# Patient Record
Sex: Male | Born: 1965 | Race: White | Hispanic: No | Marital: Married | Smoking: Current every day smoker
Health system: Southern US, Community
[De-identification: ages and names within clinical notes are randomized; demographics above are authoritative.]

## PROBLEM LIST (undated history)

## (undated) DIAGNOSIS — I10 Essential (primary) hypertension: Secondary | ICD-10-CM

## (undated) DIAGNOSIS — E7800 Pure hypercholesterolemia, unspecified: Secondary | ICD-10-CM

---

## 2004-07-20 ENCOUNTER — Ambulatory Visit (HOSPITAL_COMMUNITY): Admission: RE | Admit: 2004-07-20 | Discharge: 2004-07-20 | Payer: Self-pay | Admitting: Neurological Surgery

## 2004-08-22 ENCOUNTER — Encounter: Admission: RE | Admit: 2004-08-22 | Discharge: 2004-08-22 | Payer: Self-pay | Admitting: Neurological Surgery

## 2005-01-02 ENCOUNTER — Encounter: Admission: RE | Admit: 2005-01-02 | Discharge: 2005-01-02 | Payer: Self-pay | Admitting: Neurological Surgery

## 2005-03-02 ENCOUNTER — Encounter: Admission: RE | Admit: 2005-03-02 | Discharge: 2005-03-02 | Payer: Self-pay | Admitting: Gastroenterology

## 2005-03-08 ENCOUNTER — Ambulatory Visit: Payer: Self-pay | Admitting: Family Medicine

## 2005-03-14 ENCOUNTER — Encounter: Admission: RE | Admit: 2005-03-14 | Discharge: 2005-03-14 | Payer: Self-pay | Admitting: Gastroenterology

## 2005-05-01 ENCOUNTER — Ambulatory Visit (HOSPITAL_COMMUNITY): Admission: RE | Admit: 2005-05-01 | Discharge: 2005-05-01 | Payer: Self-pay | Admitting: Orthopedic Surgery

## 2005-05-10 ENCOUNTER — Encounter: Admission: RE | Admit: 2005-05-10 | Discharge: 2005-05-10 | Payer: Self-pay | Admitting: Orthopedic Surgery

## 2006-03-29 ENCOUNTER — Encounter: Admission: RE | Admit: 2006-03-29 | Discharge: 2006-03-29 | Payer: Self-pay | Admitting: Orthopedic Surgery

## 2006-04-02 ENCOUNTER — Encounter: Admission: RE | Admit: 2006-04-02 | Discharge: 2006-04-02 | Payer: Self-pay | Admitting: Neurological Surgery

## 2006-04-10 ENCOUNTER — Encounter: Admission: RE | Admit: 2006-04-10 | Discharge: 2006-04-10 | Payer: Self-pay | Admitting: Neurological Surgery

## 2006-05-15 ENCOUNTER — Ambulatory Visit (HOSPITAL_COMMUNITY): Admission: RE | Admit: 2006-05-15 | Discharge: 2006-05-15 | Payer: Self-pay | Admitting: Neurological Surgery

## 2006-06-18 ENCOUNTER — Encounter: Admission: RE | Admit: 2006-06-18 | Discharge: 2006-06-18 | Payer: Self-pay | Admitting: Neurological Surgery

## 2006-08-05 ENCOUNTER — Encounter: Admission: RE | Admit: 2006-08-05 | Discharge: 2006-08-05 | Payer: Self-pay | Admitting: Neurological Surgery

## 2006-10-08 ENCOUNTER — Encounter: Admission: RE | Admit: 2006-10-08 | Discharge: 2006-10-08 | Payer: Self-pay | Admitting: Neurological Surgery

## 2006-11-27 ENCOUNTER — Ambulatory Visit (HOSPITAL_COMMUNITY): Admission: RE | Admit: 2006-11-27 | Discharge: 2006-11-27 | Payer: Self-pay | Admitting: Neurological Surgery

## 2006-12-13 ENCOUNTER — Emergency Department (HOSPITAL_COMMUNITY): Admission: EM | Admit: 2006-12-13 | Discharge: 2006-12-13 | Payer: Self-pay | Admitting: Emergency Medicine

## 2007-01-02 ENCOUNTER — Ambulatory Visit (HOSPITAL_COMMUNITY): Admission: RE | Admit: 2007-01-02 | Discharge: 2007-01-02 | Payer: Self-pay | Admitting: Family Medicine

## 2007-08-26 ENCOUNTER — Ambulatory Visit (HOSPITAL_COMMUNITY): Admission: RE | Admit: 2007-08-26 | Discharge: 2007-08-26 | Payer: Self-pay | Admitting: Internal Medicine

## 2007-12-11 ENCOUNTER — Ambulatory Visit (HOSPITAL_COMMUNITY): Admission: RE | Admit: 2007-12-11 | Discharge: 2007-12-11 | Payer: Self-pay | Admitting: *Deleted

## 2008-04-11 ENCOUNTER — Emergency Department (HOSPITAL_COMMUNITY): Admission: EM | Admit: 2008-04-11 | Discharge: 2008-04-11 | Payer: Self-pay | Admitting: Emergency Medicine

## 2008-12-21 IMAGING — RF IR MYELOGRAM [PERSON_NAME]
13 of 23 series · 13 of 23 positions shown · IV contrast (omnipaque)
Comparison: none

CLINICAL DATA: Bilateral leg pain and numbness.  Right arm pain and numbness.  Prior cervical fusion. 
 TOTAL MYELOGRAM - LUMBAR COMPONENT:
TECHNIQUE: A lumbar puncture was performed from a right sided approach to the midline at the L3-4 interlaminar space using a 22 gauge spinal needle. 10 cc of Omnipaque 300 were instilled.
TECHNIQUE: Multidetector CT imaging of the lumbar spine was performed after intrathecal injection of contrast.  Multiplanar CT image reconstructions were also generated.
TECHNIQUE: Multidetector CT imaging of the cervical spine was performed after intrathecal injection of contrast.  Multiplanar CT image reconstructions were also generated.

[Series 1: (hospital) · 1 of 1 slices shown]
[im 1/1]
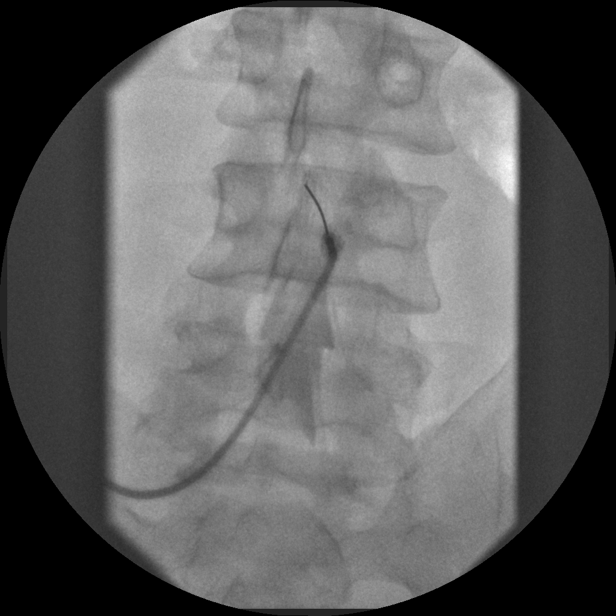

[Series 3: myelogram  white · 1 of 1 slices shown (1 of 12)]
[im 1/1]
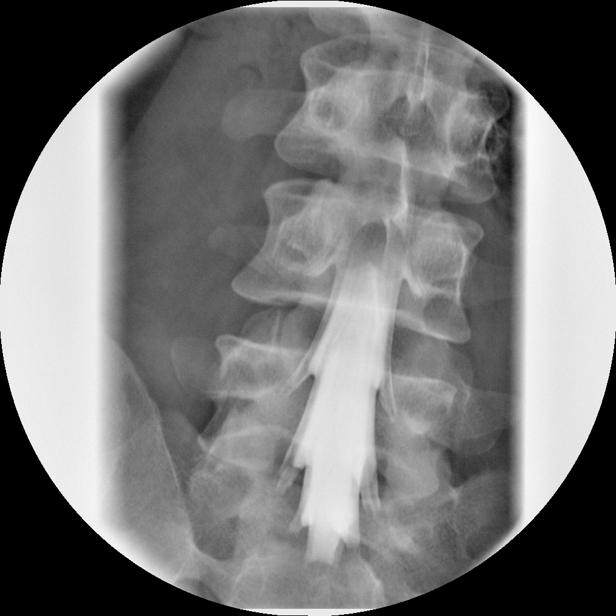

[Series 5: myelogram  white · 1 of 1 slices shown (2 of 12)]
[im 1/1]
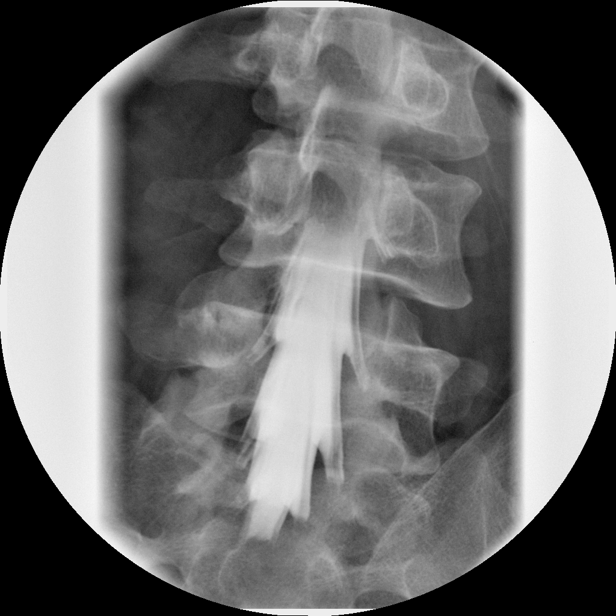

[Series 7: myelogram  white · 1 of 1 slices shown (3 of 12)]
[im 1/1]
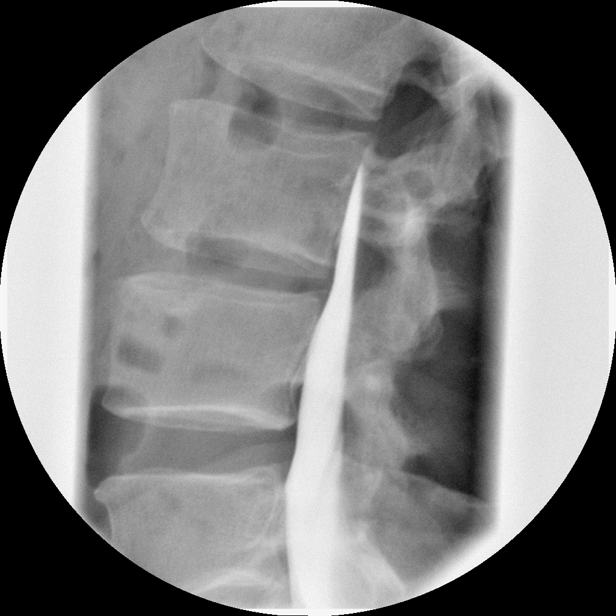

[Series 8: myelogram  white · 1 of 1 slices shown (4 of 12)]
[im 1/1]
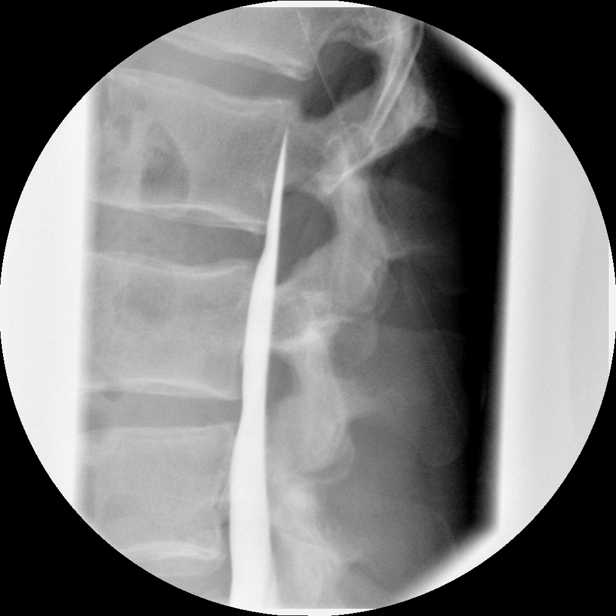

[Series 10: myelogram  white · 1 of 1 slices shown (5 of 12)]
[im 1/1]
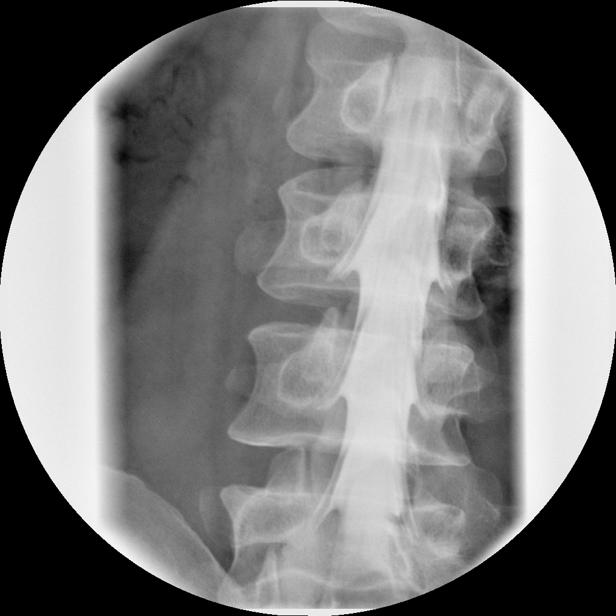

[Series 12: myelogram  white · 1 of 1 slices shown (6 of 12)]
[im 1/1]
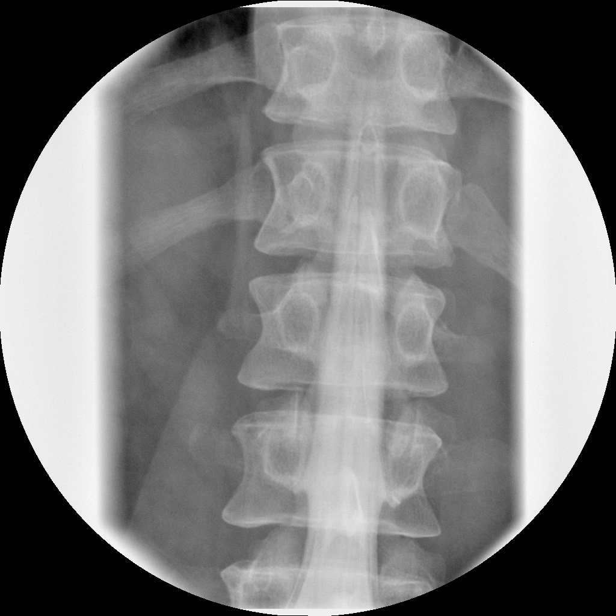

[Series 14: myelogram  white · 1 of 1 slices shown (7 of 12)]
[im 1/1]
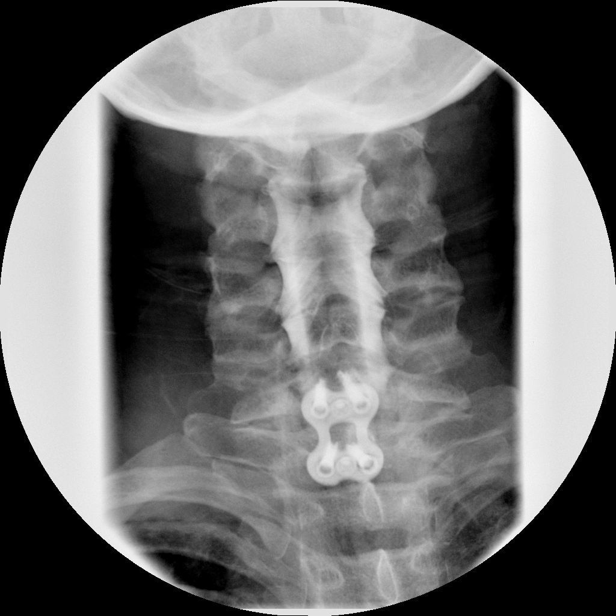

[Series 16: myelogram  white · 1 of 1 slices shown (8 of 12)]
[im 1/1]
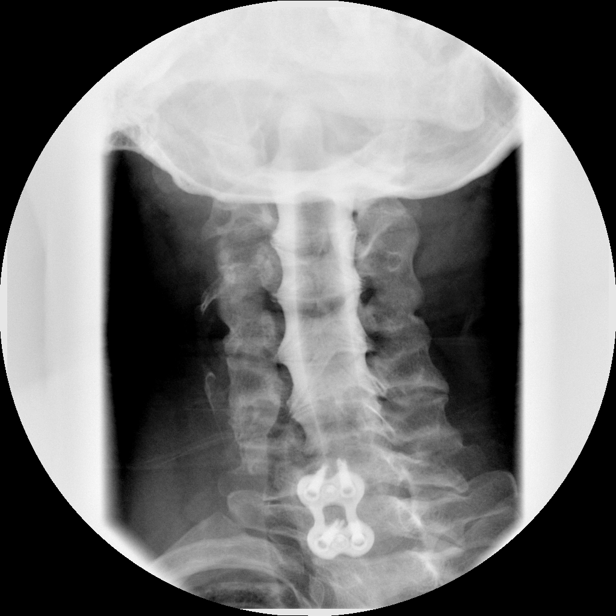

[Series 17: myelogram  white · 1 of 1 slices shown (9 of 12)]
[im 1/1]
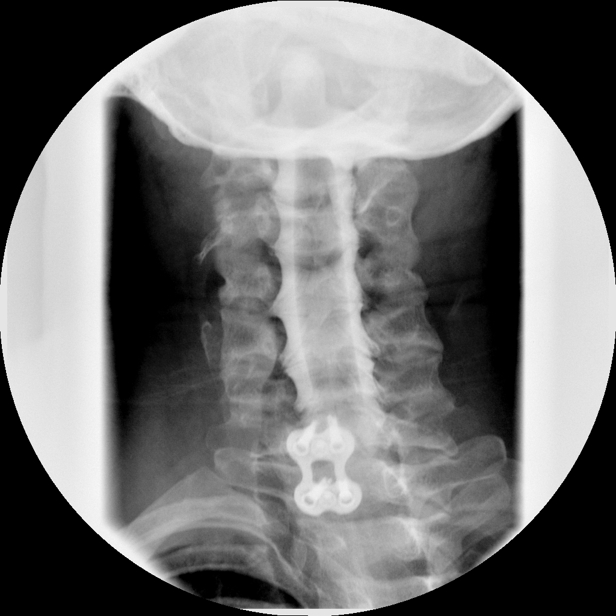

[Series 19: myelogram  white · 1 of 1 slices shown (10 of 12)]
[im 1/1]
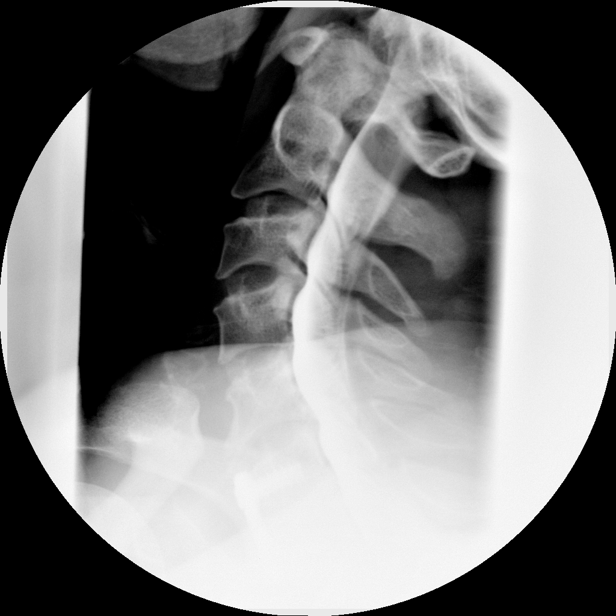

[Series 21: myelogram  white · 1 of 1 slices shown (11 of 12)]
[im 1/1]
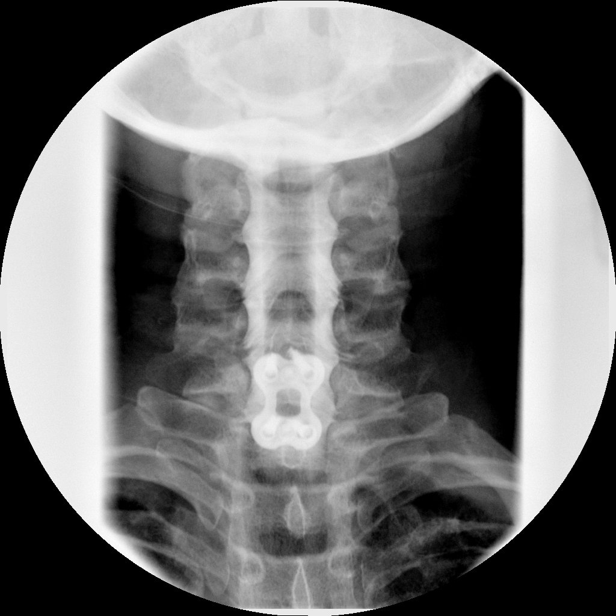

[Series 23: myelogram  white · 1 of 1 slices shown (12 of 12)]
[im 1/1]
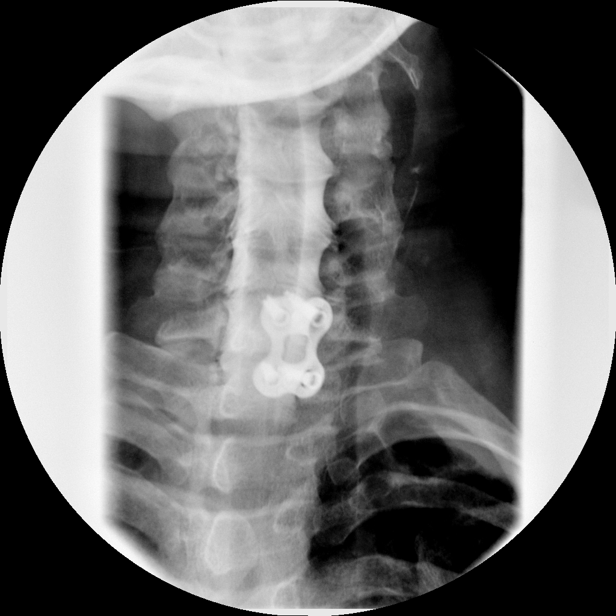

[13 of 23 positions shown; findings below may reference images not displayed]

FINDINGS: The spinal canal is widely patent.  All root sleeves fill normally.  The distal cord and conus appear normal.
IMPRESSION: Normal lumbar component of the exam. 
 TOTAL MYELOGRAM   - CERVICAL COMPONENT:
FINDINGS: The patient has had previous anterior cervical diskectomy and fusion at C6-7.  There are small anterior extradural defects at C3-4 and C5-6.  There is slightly diminished root sleeve filling of both C5 and C6 root sleeves, but this does not appear pronounced.
IMPRESSION: 1.  Prior fusion at C6-7.
 2.  Small anterior extradural defect at C3-4 and C5-6.  
 3.  Slightly diminished filling of the root sleeves of the C5 and C6 roots bilaterally. 
 POST MYELOGRAM CT SCAN OF THE LUMBAR SPINE:
FINDINGS: The alignment of the spine is normal.  The L1-2 level is normal with the exception that the conus tip is low lying, at the upper aspect of L3.
 L2-3:  Normal interspace except for the previously described low lying conus tip.  
 L3-4:  No disk bulge or herniation.  There is superior endplate Schmorl?s node anteriorly at L4.
 L4-5:  Normal interspace. 
 L5-S1:  Normal interspace.
 No pars defects or facet arthropathy.
IMPRESSION: 1.  No evidence of acquired disk pathology, stenosis, or neural compression.
 2.  Low lying conus tip, at the superior endplate of L3.
 3.  Old superior endplate Schmorl?s node at L4.
 POST MYELOGRAM CT SCAN OF THE CERVICAL SPINE:
FINDINGS: There is no abnormality at the foramen magnum, C1-2, or C2-3.  There is a small cyst in the anterosuperior C3 vertebral body which contains a bit of nitrogen gas.  This should not be significant.
 C3-4:  Minimal uncovertebral prominence with no compressive stenosis.
 C4-5:  Normal interspace. 
 C5-6:  There is uncovertebral prominence bilaterally.  There is a shallow disk protrusion.  The ventral subarachnoid space is narrowed.  There is mild encroachment upon the foramina, more on the right than the left.  It is possible the right C6 nerve root could be irritated, though distinct compression is not established. 
 C6-7:  The patient has had prior anterior cervical diskectomy and fusion.  The canal and foramina are widely patent.  There is no lucency around the screws.  
 C7-T1:  Normal interspace. 
 T1-2:  Normal interspace.
IMPRESSION: 1.  Satisfactory appearance at the fusion level of C6-7 and below.
 2.  Degenerative disease of C5-6 with uncovertebral prominence and a shallow disk protrusion.  The ventral subarachnoid space is narrowed, but the cord is not compressed.  There is encroachment upon the foramina, slightly more on the right than the left. 
 3.  Uncovertebral prominence at C3-4 without apparent neural compression.

## 2008-12-29 ENCOUNTER — Ambulatory Visit (HOSPITAL_COMMUNITY): Admission: RE | Admit: 2008-12-29 | Discharge: 2008-12-29 | Payer: Self-pay | Admitting: Family Medicine

## 2009-02-24 ENCOUNTER — Ambulatory Visit: Payer: Self-pay | Admitting: Internal Medicine

## 2009-02-24 DIAGNOSIS — J309 Allergic rhinitis, unspecified: Secondary | ICD-10-CM | POA: Insufficient documentation

## 2009-02-24 DIAGNOSIS — M545 Low back pain: Secondary | ICD-10-CM | POA: Insufficient documentation

## 2009-02-24 DIAGNOSIS — R519 Headache, unspecified: Secondary | ICD-10-CM | POA: Insufficient documentation

## 2009-02-24 DIAGNOSIS — I10 Essential (primary) hypertension: Secondary | ICD-10-CM | POA: Insufficient documentation

## 2009-02-24 DIAGNOSIS — M1990 Unspecified osteoarthritis, unspecified site: Secondary | ICD-10-CM | POA: Insufficient documentation

## 2009-02-24 DIAGNOSIS — E785 Hyperlipidemia, unspecified: Secondary | ICD-10-CM

## 2009-02-24 DIAGNOSIS — R51 Headache: Secondary | ICD-10-CM | POA: Insufficient documentation

## 2009-02-24 LAB — CONVERTED CEMR LAB: Cholesterol, target level: 200 mg/dL

## 2010-03-25 ENCOUNTER — Encounter: Payer: Self-pay | Admitting: Gastroenterology

## 2010-03-26 ENCOUNTER — Encounter: Payer: Self-pay | Admitting: Neurological Surgery

## 2010-05-02 ENCOUNTER — Other Ambulatory Visit: Payer: Self-pay | Admitting: Internal Medicine

## 2010-05-02 DIAGNOSIS — R1011 Right upper quadrant pain: Secondary | ICD-10-CM

## 2010-05-03 ENCOUNTER — Ambulatory Visit
Admission: RE | Admit: 2010-05-03 | Discharge: 2010-05-03 | Disposition: A | Discharge status: Home / Self Care | Payer: BC Managed Care – PPO | Source: Ambulatory Visit | Attending: Internal Medicine | Admitting: Internal Medicine

## 2010-05-03 DIAGNOSIS — R1011 Right upper quadrant pain: Secondary | ICD-10-CM

## 2010-06-20 LAB — BASIC METABOLIC PANEL
CO2: 27 mEq/L (ref 19–32)
Calcium: 8.8 mg/dL (ref 8.4–10.5)
GFR calc Af Amer: >60 mL/min (ref >60)
GFR calc non Af Amer: >60 mL/min (ref >60)
Potassium: 3.5 mEq/L (ref 3.5–5.1)
Sodium: 140 mEq/L (ref 135–145)

## 2010-06-20 LAB — CBC
HCT: 44.5 % (ref 39.0–52.0)
Hemoglobin: 15.1 g/dL (ref 13.0–17.0)
RDW: 14.1 % (ref 11.5–15.5)
WBC: 20.1 10*3/uL — ABNORMAL HIGH (ref 4.0–10.5)

## 2010-06-20 LAB — DIFFERENTIAL
Eosinophils Relative: 1 % (ref 0–5)
Lymphocytes Relative: 15 % (ref 12–46)
Lymphs Abs: 3 10*3/uL (ref 0.7–4.0)
Monocytes Absolute: 1 10*3/uL (ref 0.1–1.0)

## 2010-07-21 NOTE — Consult Note (Signed)
NAMELATREL, SZYMCZAK NO.:  [id number]   MEDICAL RECORD NO.:  [id number]          PATIENT TYPE:  EMS   LOCATION:  ED                            FACILITY:  [facility]   PHYSICIAN:  Tilford Pillar, MD      DATE OF BIRTH:  Jul 20, 1965   DATE OF CONSULTATION:  DATE OF DISCHARGE:  04/11/2008                                 CONSULTATION   REASON FOR CONSULTATION:  Rectal laceration.   HISTORY OF PRESENT ILLNESS:  The patient is a 45 year old male with a  history of hypertension and anxiety and multiple surgeries on both lower  extremities who states that he had an episode of a fall this evening.  The patient states that he has been caring for a rescue dog and was  walking the dog on a leash.  As the dog, which is a large breed pulled  on the leash, he did have some instability in his gait and lost his  balance and fell backwards onto the edge of his truck.  He did land up  in the presacral area.  Since that time, he has had a significant amount  of pain and bleeding noted from this area.  He stated that the bleeding  actually worsened after he had a bowel movement, but since the accident  has had normal control of his bowel movements.  He has had no fever and  chills.  He has had no inability to control flatus.  He has had no  inability to control bowel movements.   PAST MEDICAL HISTORY:  Hypertension, anxiety.   PAST SURGICAL HISTORY:  He has had left ankle surgery.  He has had a  right foot surgery.   MEDICATIONS:  Verapamil, Benicar, Valium, prednisone, Percocet.   ALLERGIES:  No known drug allergies.   SOCIAL HISTORY:  He is a one-pack per day smoker.  No alcohol use.  No  recreational drug use.   PHYSICAL EXAMINATION:  VITAL SIGNS:  97.2, heart rate 103, respirations  20, blood pressure 151/96.  GENERAL:  In general, the patient is comfortable in this.  He is not in  any acute distress.  He is alert and oriented x3.  He is sitting in a  seated position in an  emergency room cart.  HEENT:  Pupils are equal, round, reactive.  Extraocular movements are  intact.  Oral mucosa is pink.  No malocclusion.  PULMONARY:  Unlabored.  He is clear to auscultation.  CARDIOVASCULAR:  He is tachycardic, but regular rhythm.  No murmurs are  apparent on exam today.  ABDOMINAL EXAM:  Soft, nontender.  On evaluation of his perirectal area,  there is an approximate 3-cm laceration.  This is over the coccygeal  region.  He is exquisitely tender.  There is some ecchymosis surrounding  the area.  Rectal:  On digital rectal exam, he has good sphincter tone.  There is  no evidence of any internal laceration or continuation of the laceration  into the rectum.   ASSESSMENT/PLAN:  Perirectal laceration.  At this time, I did discuss  with the patient the  risks, benefits and alternatives of closure.  At  this time, I will irrigate the wound and plan to proceed with  interrupted closure.   PROCEDURE:  At this time, the wound was cleansed.  Betadine was used for  prep, and 1% lidocaine was injected around the area.  A 3-0 chromic was  used in an interrupted fashion to reapproximate the skin around the  area.  I did scan together without any difficulties, and at this time, a  dressing was placed.  The patient will be discharged with close  followup.  He will be continued on oral antibiotics.  He is instructed  on continuing sitz bath, especially after bowel movements.  He is to  call if he develops any fever, chills, or any increased drainage in the  area.  He is to follow up in my office this coming Thursday.  The  patient understands and will call if he has any problems or  difficulties.      Tilford Pillar, MD  Electronically Signed     BZ/MEDQ  D:  04/13/2008  T:  04/14/2008  Job:  [id number]

## 2010-07-21 NOTE — Op Note (Signed)
NAMEGILBERT, MANOLIS NO.:  [id number]   MEDICAL RECORD NO.:  [id number]          PATIENT TYPE:  OIB   LOCATION:  2550                         FACILITY:  [facility]   PHYSICIAN:  Tia Alert, MD     DATE OF BIRTH:  09-26-1965   DATE OF PROCEDURE:  07/20/2004  DATE OF DISCHARGE:                                 OPERATIVE REPORT   PREOPERATIVE DIAGNOSES:  Cervical spondylosis C6-C7, with left C7  radiculopathy.   POSTOPERATIVE DIAGNOSES:  Cervical spondylosis, C6-C7, with left C7  radiculopathy.   PROCEDURES:  1.  Decompressive anterior cervical diskectomy, C6-C7.  2.  Anterior cervical arthrodesis, C6-C7, utilizing a VG2 allograft.  3.  Anterior cervical plating, C6-C7, utilizing a 25-mm Atlantis Vision      plate.   SURGEON:  Tia Alert, M.D.   ASSISTANT:  Reinaldo Meeker, M.D.   ANESTHESIA:  General endotracheal.   COMPLICATIONS:  None apparent.   INDICATIONS FOR THE PROCEDURE:  Mr. Kotowski is a 45 year old white male who  was referred with neck and left arm pain in the C7 distribution. He had MRI  which showed cervical spondylosis with a large osteophytic spur to the left  at C6-C7 with narrowing of the left C6-C7 foramen. He had a C7  radiculopathy.  He failed to respond to medical management. I recommended a  decompressive anterior cervical diskectomy and fusion with plating at C6-C7.  He understood the risks, benefits, and alternatives and wished to proceed.   DESCRIPTION OF THE PROCEDURE:  The patient was taken to the operating room  and after induction of adequate generalized endotracheal anesthesia, he was  placed in supine position on the operating room table. His right anterior  cervical region was prepped with DuraPrep and then draped in the usual  sterile fashion. Five cc of local anesthesia was injected, and an incision  was made to the right of the midline and carried down to the platysma muscle  which was elevated, opened,and  undermined with Metzenbaum scissors. I then  dissected in a plane medial to the sternocleidomastoid muscle and internal  carotid artery and lateral to the trachea and  esophagus to expose the  anterior cervical spine at C6-C7. Intraoperative x-ray confirmed my level,  and then the longus colli muscles were taken down, and the Shuttle line  retractors were placed under these. The annulus at C6-C7 was incised, and  the initial diskectomy was done with pituitary rongeurs and curved curettes.  I then used the drill to drill the endplates to prepare for later  arthrodesis. I drilled down to the level of the posterior longitudinal  ligament which was then opened with a nerve hook and utilizing microscopic  dissection, it was removed in a circumferential fashion along with the  posterior osteophytes. Bilateral foraminotomy was performed.  Great care was  used to make sure the left C7 nerve root was fully decompressed. We followed  the nerve root out into the foramen and dissected until we were flush with  the pedicle medially and superiorly.  We saw a significant leaf of the C7  nerve root. Once the decompression was complete, we irrigated with saline  solution, dried all bleeding points, placed Gelfoam, removed the Gelfoam,  and then used an 8-mm graft and tapped this into position. We then used a 25-  mm Atlantis Vision plate and placed 2 13-mm variable angle screws in the  body C6 and two in the body of C7 and locked these into position with the  locking mechanism. We then irrigated with copious amounts of bacitracin-  containing saline solution, dried all bleeding points were bipolar cautery,  and once meticulous hemostasis was achieved, closed the platysma with 3-0  Vicryl.  We closed the subcuticular tissue with 3-0 Vicryl and closed the  skin with Benzoin and Steri-Strips. The drapes were removed. Sterile  dressing was applied. The patient was awakened from general anesthesia and   transferred to the recovery room in stable condition. At the end of the  procedure, all sponge, needle and instrument counts were correct.      DSJ/MEDQ  D:  07/20/2004  T:  07/20/2004  Job:  [id number]

## 2010-07-21 NOTE — Op Note (Signed)
NAMEERLE, GUSTER NO.:  [id number]   MEDICAL RECORD NO.:  [id number]          PATIENT TYPE:  AMB   LOCATION:  SDS                          FACILITY:  [facility]   PHYSICIAN:  Tia Alert, MD     DATE OF BIRTH:  1965/08/05   DATE OF PROCEDURE:  05/15/2006  DATE OF DISCHARGE:                               OPERATIVE REPORT   PREOPERATIVE DIAGNOSIS:  Adjacent level disc herniation C5-C6 with neck  and bilateral arm pain.   POSTOPERATIVE DIAGNOSIS:  Adjacent level disc herniation C5-C6 with neck  and bilateral arm pain.   PROCEDURE:  1. Anterior cervical re-exploration with removal of hardware C6-C7 and      exploration of fusion C6-C7.  2. Anterior cervical discectomy C5-C6 for central canal nerve root      decompression.  3. Anterior cervical arthrodesis C5-C6 utilizing a 7-mm      corticocancellous allograft.  4. Anterior cervical plating C5-C6 utilizing a 25 mm Atlantis Vision      plate.   SURGEON:  Tia Alert, M.D.   ASSISTANT:  Donalee Citrin, M.D.   ANESTHESIA:  General endotracheal anesthesia.   COMPLICATIONS:  None apparent.   INDICATIONS FOR PROCEDURE:  Mr. Pamer is a 45 year old male who was  referred with neck and bilateral arm pain.  He had a CT myelogram which  showed adjacent level disc herniation at C5-C6, more to the right than  to the left, with a solid appearing fusion at C6-C7 where he had  undergone a previous ACDF with plating in the past.  I recommended  decompressive anterior cervical discectomy, fusion, and plating at C5-C6  with removal of the plate at Z6-X0.  He understood the risks, benefits,  and expected outcome and wished to proceed.   DESCRIPTION OF PROCEDURE:  The patient was taken to the operating room  and after induction of adequate generalized endotracheal anesthesia, he  was placed in the supine position on the operating room table.  His  right anterior cervical region was prepped with DuraPrep and draped in  the  usual sterile fashion.  5 mL local anesthesia was injected and his  old incision was opened and the incision was carried down to the  platysma which was elevated, opened, and undermined with Metzenbaum  scissors.  I then dissected in a plane medial to the sternocleidomastoid  muscle and internal carotid artery and lateral to the trachea and  esophagus to expose the old plate at R6-E4.  I was able to remove the  four screws in the plate and then removed the plate with a hemostat.  We  inspected the fusion at C6-C7 and it appeared solid.  We then removed  the anterior osteophyte off the C5-C6 disc space, incised the disc space  at C5-C6, and performed the initial discectomy with pituitary rongeurs  and curved curets.  Intraoperative fluoroscopy confirmed my level at C5-  C6.  We drilled the endplates to prepare for later arthrodesis.  We  drilled to a height of 7 mm.  We drilled down to the level of the  posterior  longitudinal ligament.  The operating microscope was brought  to the field.  The posterior longitudinal ligament was opened with a  nerve hook and then removed in a circumferential fashion while  undercutting the bodies of C5 and C6.  To decompress the central canal,  bilateral foraminotomies were performed by identifying the pedicles,  marching along the pedicles, identifying the nerve root, and then  unroofing the nerve root distally into the foramina bilaterally.  We  then palpated with a nerve hook into the midline and into the foramen to  assure adequate decompression.  The nerves appeared to be well  decompressed.  The central canal was well decompressed. The dura was  capacious and full all the way across.  We then irrigated with saline  solution, measured our decompression to be 7 mm in height.  I used a 7-  mm MTF corticocancellous allograft and tapped this into position at C5-  C6.  We then used a 25 mm plate and placed two 13 mm rescue screws into  the old holes at C6  and placed two 13 mm variable angle screws in the  bodies of C5.  These locked into position with the locking mechanism on  the plate.  We then irrigated with saline solution containing  bacitracin, dried all bleeding points with bipolar cautery, and once  meticulous hemostasis was achieved, closed the platysma with 3-0 Vicryl,  closed the subcuticular tissue with 3-0 Vicryl, and closed the skin with  Benzoin and Steri-Strips.  The drapes were removed and a sterile  dressing was applied.  The patient was awakened from general anesthesia  and transferred to the recovery room in stable condition.  At the end of  the procedure, all sponge, needle and instrument counts were correct.      Tia Alert, MD  Electronically Signed     DSJ/MEDQ  D:  05/15/2006  T:  05/16/2006  Job:  [id number]

## 2016-07-18 DIAGNOSIS — D485 Neoplasm of uncertain behavior of skin: Secondary | ICD-10-CM | POA: Diagnosis not present

## 2016-07-19 DIAGNOSIS — C4491 Basal cell carcinoma of skin, unspecified: Secondary | ICD-10-CM | POA: Diagnosis not present

## 2016-07-27 DIAGNOSIS — I10 Essential (primary) hypertension: Secondary | ICD-10-CM | POA: Diagnosis not present

## 2016-07-27 DIAGNOSIS — C44310 Basal cell carcinoma of skin of unspecified parts of face: Secondary | ICD-10-CM | POA: Diagnosis not present

## 2016-09-17 DIAGNOSIS — I10 Essential (primary) hypertension: Secondary | ICD-10-CM | POA: Diagnosis not present

## 2016-09-17 DIAGNOSIS — G8929 Other chronic pain: Secondary | ICD-10-CM | POA: Diagnosis not present

## 2016-09-17 DIAGNOSIS — Z125 Encounter for screening for malignant neoplasm of prostate: Secondary | ICD-10-CM | POA: Diagnosis not present

## 2016-09-17 DIAGNOSIS — R7309 Other abnormal glucose: Secondary | ICD-10-CM | POA: Diagnosis not present

## 2016-09-24 ENCOUNTER — Other Ambulatory Visit: Payer: Self-pay | Admitting: Internal Medicine

## 2016-09-24 DIAGNOSIS — Z0000 Encounter for general adult medical examination without abnormal findings: Secondary | ICD-10-CM | POA: Diagnosis not present

## 2016-09-24 DIAGNOSIS — M5440 Lumbago with sciatica, unspecified side: Secondary | ICD-10-CM | POA: Diagnosis not present

## 2016-09-24 DIAGNOSIS — R945 Abnormal results of liver function studies: Secondary | ICD-10-CM

## 2016-09-24 DIAGNOSIS — K7689 Other specified diseases of liver: Secondary | ICD-10-CM | POA: Diagnosis not present

## 2016-10-02 ENCOUNTER — Ambulatory Visit
Admission: RE | Admit: 2016-10-02 | Discharge: 2016-10-02 | Disposition: A | Discharge status: Home / Self Care | Payer: Medicare HMO | Source: Ambulatory Visit | Attending: Internal Medicine | Admitting: Internal Medicine

## 2016-10-02 DIAGNOSIS — R945 Abnormal results of liver function studies: Secondary | ICD-10-CM

## 2016-10-22 DIAGNOSIS — K7689 Other specified diseases of liver: Secondary | ICD-10-CM | POA: Diagnosis not present

## 2016-10-22 DIAGNOSIS — Z7289 Other problems related to lifestyle: Secondary | ICD-10-CM | POA: Diagnosis not present

## 2016-10-22 DIAGNOSIS — Z79899 Other long term (current) drug therapy: Secondary | ICD-10-CM | POA: Diagnosis not present

## 2016-10-29 DIAGNOSIS — I10 Essential (primary) hypertension: Secondary | ICD-10-CM | POA: Diagnosis not present

## 2016-10-29 DIAGNOSIS — K7689 Other specified diseases of liver: Secondary | ICD-10-CM | POA: Diagnosis not present

## 2016-10-29 DIAGNOSIS — E7800 Pure hypercholesterolemia, unspecified: Secondary | ICD-10-CM | POA: Diagnosis not present

## 2016-10-29 DIAGNOSIS — M5440 Lumbago with sciatica, unspecified side: Secondary | ICD-10-CM | POA: Diagnosis not present

## 2016-12-08 ENCOUNTER — Emergency Department (HOSPITAL_COMMUNITY): Payer: Medicare HMO

## 2016-12-08 ENCOUNTER — Emergency Department (HOSPITAL_COMMUNITY)
Admission: EM | Admit: 2016-12-08 | Discharge: 2016-12-08 | Disposition: A | Discharge status: Home / Self Care | Payer: Medicare HMO | Attending: Emergency Medicine | Admitting: Emergency Medicine

## 2016-12-08 ENCOUNTER — Encounter (HOSPITAL_COMMUNITY): Payer: Self-pay

## 2016-12-08 DIAGNOSIS — Y92009 Unspecified place in unspecified non-institutional (private) residence as the place of occurrence of the external cause: Secondary | ICD-10-CM | POA: Insufficient documentation

## 2016-12-08 DIAGNOSIS — W108XXA Fall (on) (from) other stairs and steps, initial encounter: Secondary | ICD-10-CM | POA: Diagnosis not present

## 2016-12-08 DIAGNOSIS — S8252XA Displaced fracture of medial malleolus of left tibia, initial encounter for closed fracture: Secondary | ICD-10-CM | POA: Diagnosis not present

## 2016-12-08 DIAGNOSIS — Y999 Unspecified external cause status: Secondary | ICD-10-CM | POA: Diagnosis not present

## 2016-12-08 DIAGNOSIS — S8992XA Unspecified injury of left lower leg, initial encounter: Secondary | ICD-10-CM | POA: Diagnosis present

## 2016-12-08 DIAGNOSIS — S82892A Other fracture of left lower leg, initial encounter for closed fracture: Secondary | ICD-10-CM | POA: Insufficient documentation

## 2016-12-08 DIAGNOSIS — S82832A Other fracture of upper and lower end of left fibula, initial encounter for closed fracture: Secondary | ICD-10-CM | POA: Diagnosis not present

## 2016-12-08 DIAGNOSIS — Y9301 Activity, walking, marching and hiking: Secondary | ICD-10-CM | POA: Insufficient documentation

## 2016-12-08 DIAGNOSIS — F17210 Nicotine dependence, cigarettes, uncomplicated: Secondary | ICD-10-CM | POA: Diagnosis not present

## 2016-12-08 DIAGNOSIS — S8262XA Displaced fracture of lateral malleolus of left fibula, initial encounter for closed fracture: Secondary | ICD-10-CM | POA: Diagnosis not present

## 2016-12-08 DIAGNOSIS — I10 Essential (primary) hypertension: Secondary | ICD-10-CM | POA: Insufficient documentation

## 2016-12-08 HISTORY — DX: Pure hypercholesterolemia, unspecified: E78.00

## 2016-12-08 HISTORY — DX: Essential (primary) hypertension: I10

## 2016-12-08 NOTE — Progress Notes (Signed)
Orthopedic Tech Progress Note Patient Details:  James Castro 07/30/1965 [id number]  Ortho Devices Type of Ortho Device: Ace wrap, Crutches, Stirrup splint, Short leg splint Ortho Device/Splint Interventions: Application   [place] Pink 12/08/2016, 2:06 PM

## 2016-12-08 NOTE — ED Provider Notes (Signed)
[facility] DEPT Provider Note   CSN: [id number] Arrival date & time: 12/08/16  1016     History   Chief Complaint No chief complaint on file.   HPI James Castro is a 51 y.o. male.  James Castro is a 51 y.o. Male who presents to ED with left ankle and foot pain and swelling. Patient reports he took a step down his front steps to leave his house and slipped on the stairs and fell onto his left foot and ankle. He slipped about three steps. He did not hit his head or LOC. He took percocet prior to arrival. He takes chronic narcotic pain medicines and declines any pain medication at this time. Patient did not try any other medications and refuses to put ice on it. Denies hitting his head, weakness, lightheadedness, numbness or tingling in his right foot.       Past Medical History:  Diagnosis Date  . High cholesterol   . Hypertension     Patient Active Problem List   Diagnosis Date Noted  . HYPERLIPIDEMIA 02/24/2009  . HYPERTENSION 02/24/2009  . ALLERGIC RHINITIS 02/24/2009  . OSTEOARTHRITIS 02/24/2009  . LOW BACK PAIN 02/24/2009  . HEADACHE 02/24/2009    History reviewed. No pertinent surgical history.     Home Medications    Prior to Admission medications   Not on File    Family History No family history on file.  Social History Social History  Substance Use Topics  . Smoking status: Current Every Day Smoker    Types: Cigarettes  . Smokeless tobacco: Never Used  . Alcohol use Not on file     Allergies   Patient has no known allergies.   Review of Systems Review of Systems  Constitutional: Negative for fever.  Respiratory: Negative for shortness of breath.   Cardiovascular: Negative for chest pain.  Gastrointestinal: Negative for abdominal pain.  Musculoskeletal: Positive for arthralgias and joint swelling.  Skin: Negative for color change, rash and wound.  Neurological: Negative for syncope, weakness, light-headedness and numbness.      Physical Exam Updated Vital Signs BP (!) 152/85 (BP Location: Right Arm)   Pulse 70   Temp 98.9 F (37.2 C) (Oral)   Resp 16   SpO2 95%   Physical Exam  Constitutional: He appears well-developed and well-nourished. No distress.  Nontoxic-appearing.  HENT:  Head: Normocephalic and atraumatic.  Eyes: Right eye exhibits no discharge. Left eye exhibits no discharge.  Cardiovascular: Normal rate, regular rhythm and intact distal pulses.   Bilateral dorsalis pedis pulses are intact.  Pulmonary/Chest: Effort normal. No respiratory distress.  Musculoskeletal: He exhibits edema, tenderness and deformity.  Left foot and ankle edema. Worse to medial aspect of left ankle.  Ankle TTP over medial and lateral malleolus and TTP over anterior left foot and MTPs. Decreased active ROM left foot. DP pulse 2+. No deformity or open wounds.   Neurological: He is alert. No sensory deficit. Coordination normal.  Sensation intact BL LE.   Skin: Skin is warm. Capillary refill takes less than 2 seconds. No rash noted. He is not diaphoretic. There is erythema. No pallor.  Psychiatric: He has a normal mood and affect. His behavior is normal.  Nursing note and vitals reviewed.    ED Treatments / Results  Labs (all labs ordered are listed, but only abnormal results are displayed) Labs Reviewed - No data to display  EKG  EKG Interpretation None       Radiology Dg Ankle Complete  Left  Result Date: 12/08/2016 CLINICAL DATA:  Pt reports missing a step today and fell onto left side, injuring left foot and ankle. Significant swelling noted EXAM: LEFT ANKLE COMPLETE - 3+ VIEW COMPARISON:  None. FINDINGS: There is a mildly displaced fracture of the distal fibular diaphysis with 4 mm of medial displacement. There is a transverse fracture of the medial malleolus without significant displacement. There is no other fracture or dislocation. Ankle mortise is intact. There is no evidence of arthropathy or other  focal bone abnormality. Soft tissue swelling overlying the medial malleolus. Soft tissue swelling over the distal fibular diaphysis. IMPRESSION: Mildly displaced fracture of the distal fibular diaphysis. Transverse fracture of the medial malleolus. Electronically Signed   By: Kathreen Devoid   On: 12/08/2016 11:25   Dg Foot Complete Left  Result Date: 12/08/2016 CLINICAL DATA:  Pt reports missing a step today and fell onto left side, injuring left foot and ankle. Significant swelling noted EXAM: LEFT FOOT - COMPLETE 3+ VIEW COMPARISON:  None. FINDINGS: There is a mildly displaced fracture of the distal fibular diaphysis. There is a transverse fracture of the medial malleolus. There is no other fracture or dislocation. 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 There is no evidence of arthropathy or other focal bone abnormality. Soft tissues are unremarkable. IMPRESSION: Mildly displaced fracture of the distal fibular diaphysis. Transverse fracture of the medial malleolus. Otherwise, no acute osseous injury of the left foot. Electronically Signed   By: Kathreen Devoid   On: 12/08/2016 11:21    Procedures Procedures (including critical care time)  SPLINT APPLICATION Date/Time: 8:54 PM Authorized by: Hanley Hays Consent: Verbal consent obtained. Risks and benefits: risks, benefits and alternatives were discussed Consent given by: patient Splint applied by: orthopedic technician Location details: left LE Splint type: Posterior splint and stirrup  Post-procedure: The splinted body part was neurovascularly unchanged following the procedure. Patient tolerance: Patient tolerated the procedure well with no immediate complications.     Medications Ordered in ED Medications - No data to display   Initial Impression / Assessment and Plan / ED Course  I have reviewed the triage vital signs and the nursing notes.  Pertinent labs & imaging results that were  available during my care of the patient were reviewed by me and considered in my medical decision making (see chart for details).     This  is a 51 y.o. Male who presents to ED with left ankle and foot pain and swelling. Patient reports he took a step down his front steps to leave his house and slipped on the stairs and fell onto his left foot and ankle. He slipped about three steps. He did not hit his head or LOC. He took percocet prior to arrival. He takes chronic narcotic pain medicines and declines any pain medication at this time.  On exam patient is afebrile nontoxic-appearing. He has significant edema noted to his left foot and ankle that is worse at the medial aspect of his left ankle. No open wounds. No obvious deformity. He is neurovascularly intact. Bilateral dorsalis pedis pulses are intact. Good capillary refill. X-rays were obtained of his left foot and ankle that show a mildly displaced fracture of the distal fibular diaphysis and a transverse fracture of the medial malleolus. I consulted with orthopedic surgeon Dr. Alvan Dame who reviewed the films. He reports he liked the patient in a posterior and stirrup splint and have him nonweightbearing with crutches until follow-up. He will  follow-up as an outpatient next week with orthopedic surgeon Dr. Doran Durand. Plan for surgery at a later date after edema improves. Patient placed in splint and provided with crutches. Advised nonweightbearing work on elevating the leg to reduce edema and also encouraged him to use ice. He has chronic medications for pain at home with Percocet. He declines any further pain medicine. I advise follow-up with Dr. Doran Durand and discussed return precautions. Splint care and precautions provided. I advised the patient to follow-up with their primary care provider this week. I advised the patient to return to the emergency department with new or worsening symptoms or new concerns. The patient verbalized understanding and agreement with  plan.     Final Clinical Impressions(s) / ED Diagnoses   Final diagnoses:  Other closed fracture of distal end of left fibula, initial encounter  Displaced fracture of medial malleolus of left tibia, initial encounter for closed fracture    New Prescriptions New Prescriptions   No medications on file     Waynetta Pean, Hershal Coria 12/08/16 [address], Grayce Sessions, MD 12/09/16 1238

## 2016-12-08 NOTE — ED Triage Notes (Signed)
PT refused ice pack to LT ankle.

## 2016-12-10 ENCOUNTER — Emergency Department (HOSPITAL_COMMUNITY)
Admission: EM | Admit: 2016-12-10 | Discharge: 2016-12-11 | Disposition: A | Discharge status: Home / Self Care | Payer: Medicare HMO | Attending: Emergency Medicine | Admitting: Emergency Medicine

## 2016-12-10 DIAGNOSIS — S82892D Other fracture of left lower leg, subsequent encounter for closed fracture with routine healing: Secondary | ICD-10-CM

## 2016-12-10 DIAGNOSIS — F17210 Nicotine dependence, cigarettes, uncomplicated: Secondary | ICD-10-CM | POA: Insufficient documentation

## 2016-12-10 DIAGNOSIS — I10 Essential (primary) hypertension: Secondary | ICD-10-CM | POA: Diagnosis not present

## 2016-12-10 DIAGNOSIS — Z79899 Other long term (current) drug therapy: Secondary | ICD-10-CM | POA: Diagnosis not present

## 2016-12-10 DIAGNOSIS — S82832D Other fracture of upper and lower end of left fibula, subsequent encounter for closed fracture with routine healing: Secondary | ICD-10-CM | POA: Insufficient documentation

## 2016-12-10 DIAGNOSIS — W010XXA Fall on same level from slipping, tripping and stumbling without subsequent striking against object, initial encounter: Secondary | ICD-10-CM | POA: Diagnosis not present

## 2016-12-10 LAB — COMPREHENSIVE METABOLIC PANEL
ALT: 26 U/L (ref 17–63)
AST: 19 U/L (ref 15–41)
Albumin: 4 g/dL (ref 3.5–5.0)
Alkaline Phosphatase: 105 U/L (ref 38–126)
Anion gap: 11 (ref 5–15)
BUN: 12 mg/dL (ref 6–20)
CHLORIDE: 102 mmol/L (ref 101–111)
CO2: 25 mmol/L (ref 22–32)
Calcium: 9.2 mg/dL (ref 8.9–10.3)
Creatinine, Ser: 0.96 mg/dL (ref 0.61–1.24)
GFR calc Af Amer: >60 mL/min (ref >60)
GFR calc non Af Amer: >60 mL/min (ref >60)
Glucose, Bld: 108 mg/dL — ABNORMAL HIGH (ref 65–99)
Potassium: 4.2 mmol/L (ref 3.5–5.1)
SODIUM: 138 mmol/L (ref 135–145)
Total Bilirubin: 0.6 mg/dL (ref 0.3–1.2)
Total Protein: 7.1 g/dL (ref 6.5–8.1)

## 2016-12-10 LAB — CBC
HCT: 43.5 % (ref 39.0–52.0)
Hemoglobin: 14.4 g/dL (ref 13.0–17.0)
MCH: 31 pg (ref 26.0–34.0)
MCHC: 33.1 g/dL (ref 30.0–36.0)
MCV: 93.5 fL (ref 78.0–100.0)
PLATELETS: 276 10*3/uL (ref 150–400)
RBC: 4.65 MIL/uL (ref 4.22–5.81)
RDW: 13.3 % (ref 11.5–15.5)
WBC: 14.9 10*3/uL — AB (ref 4.0–10.5)

## 2016-12-10 NOTE — ED Notes (Signed)
Pt wife to NF questing update, informed her of our longest wait time. Offered pain medicine to pt and she states she will ask her husband but he already takes Percocet at home for chronic back pain

## 2016-12-10 NOTE — ED Triage Notes (Signed)
Pt reports seen here Saturday for left ankle fracture. This morning at 1am woke with severe left ankle pain. Unwrapped splint and noted to have large purple and clear fluid filled blister. Pt with low grade temp. Denies fever at home. Distal circulation intact.

## 2016-12-11 NOTE — ED Notes (Signed)
Ortho tech at bedside applying splint at right foot.

## 2016-12-11 NOTE — ED Provider Notes (Signed)
[facility] DEPT Provider Note   CSN: [id number] Arrival date & time: 12/10/16  1744     History   Chief Complaint Chief Complaint  Patient presents with  . Foot Pain    HPI James Castro is a 51 y.o. male.  Patient presents emergency department with chief complaint of left ankle pain and swelling. He states that he fell 3 days ago and sustained a left distal fibula and left medial malleolus fracture. He states that his pain worsened yesterday, and he had to remove the splint today because the pain. He noticed a large blister to the medial ankle. He denies measured fever.  He denies numbness, weakness, or tingling.  He states that his pain has been fairly well controlled with his home percocet, but it was the large blister that made him the most nervous.   The history is provided by the patient. No language interpreter was used.    Past Medical History:  Diagnosis Date  . High cholesterol   . Hypertension     Patient Active Problem List   Diagnosis Date Noted  . HYPERLIPIDEMIA 02/24/2009  . HYPERTENSION 02/24/2009  . ALLERGIC RHINITIS 02/24/2009  . OSTEOARTHRITIS 02/24/2009  . LOW BACK PAIN 02/24/2009  . HEADACHE 02/24/2009    No past surgical history on file.     Home Medications    Prior to Admission medications   Medication Sig Start Date End Date Taking? Authorizing Provider  atorvastatin (LIPITOR) 40 MG tablet Take 40 mg by mouth daily. 11/27/16   [provider]  diazepam (VALIUM) 5 MG tablet Take 5 mg by mouth 2 (two) times daily. 11/08/16   [provider]  losartan (COZAAR) 50 MG tablet Take 50 mg by mouth daily. 11/27/16   [provider]  oxyCODONE-acetaminophen (PERCOCET) 10-325 MG tablet Take 1 tablet by mouth every 4 (four) hours. 11/30/16   [provider]  Verapamil HCl CR 300 MG CP24 Take 300 mg by mouth daily. 11/28/16   [provider]    Family History No family history on file.  Social  History Social History  Substance Use Topics  . Smoking status: Current Every Day Smoker    Types: Cigarettes  . Smokeless tobacco: Never Used  . Alcohol use Not on file     Allergies   Patient has no known allergies.   Review of Systems Review of Systems  All other systems reviewed and are negative.    Physical Exam Updated Vital Signs BP (!) 151/82 (BP Location: Left Arm)   Pulse 78   Temp 98.8 F (37.1 C) (Oral)   Resp 16   SpO2 96%   Physical Exam  Constitutional: He is oriented to person, place, and time. He appears well-developed and well-nourished.  HENT:  Head: Normocephalic and atraumatic.  Eyes: Pupils are equal, round, and reactive to light. Conjunctivae and EOM are normal. Right eye exhibits no discharge. Left eye exhibits no discharge. No scleral icterus.  Neck: Normal range of motion. Neck supple. No JVD present.  Cardiovascular: Normal rate, regular rhythm and normal heart sounds.  Exam reveals no gallop and no friction rub.   No murmur heard. Pulmonary/Chest: Effort normal and breath sounds normal. No respiratory distress. He has no wheezes. He has no rales. He exhibits no tenderness.  Abdominal: Soft. He exhibits no distension and no mass. There is no tenderness. There is no rebound and no guarding.  Musculoskeletal: He exhibits edema and tenderness.  Significant swelling of the left ankle,  ROM and strength deferred 2/2 pain  Neurological: He is alert and oriented to person, place, and time.  Skin: Skin is warm and dry.  Large vesicle to the medial ankle, no evidence of infection  Psychiatric: He has a normal mood and affect. His behavior is normal. Judgment and thought content normal.  Nursing note and vitals reviewed.    ED Treatments / Results  Labs (all labs ordered are listed, but only abnormal results are displayed) Labs Reviewed  CBC - Abnormal; Notable for the following:       Result Value   WBC 14.9 (*)    All other components within  normal limits  COMPREHENSIVE METABOLIC PANEL - Abnormal; Notable for the following:    Glucose, Bld 108 (*)    All other components within normal limits    EKG  EKG Interpretation None       Radiology No results found.  Procedures Procedures (including critical care time)  Medications Ordered in ED Medications - No data to display   Initial Impression / Assessment and Plan / ED Course  I have reviewed the triage vital signs and the nursing notes.  Pertinent labs & imaging results that were available during my care of the patient were reviewed by me and considered in my medical decision making (see chart for details).     Patient with recent ankle fracture.  Presents with increased swelling and a blister, no open wounds or signs of infection.  Will reapply splint with bulky dressing.  Discussed with Dr. Betsey Holiday, who agrees with plan.  Final Clinical Impressions(s) / ED Diagnoses   Final diagnoses:  Closed fracture of left ankle with routine healing, subsequent encounter    New Prescriptions New Prescriptions   No medications on file     Montine Circle, PA-C 12/11/16 2409    Orpah Greek, MD 12/11/16 [phone]

## 2016-12-13 ENCOUNTER — Ambulatory Visit
Admission: RE | Admit: 2016-12-13 | Discharge: 2016-12-13 | Disposition: A | Discharge status: Home / Self Care | Payer: Medicare HMO | Source: Ambulatory Visit | Attending: Student | Admitting: Student

## 2016-12-13 ENCOUNTER — Other Ambulatory Visit: Payer: Self-pay | Admitting: Student

## 2016-12-13 DIAGNOSIS — S82842A Displaced bimalleolar fracture of left lower leg, initial encounter for closed fracture: Secondary | ICD-10-CM

## 2016-12-13 DIAGNOSIS — S8292XA Unspecified fracture of left lower leg, initial encounter for closed fracture: Secondary | ICD-10-CM | POA: Diagnosis not present

## 2016-12-21 DIAGNOSIS — S82492D Other fracture of shaft of left fibula, subsequent encounter for closed fracture with routine healing: Secondary | ICD-10-CM | POA: Diagnosis not present

## 2016-12-21 DIAGNOSIS — S82872P Displaced pilon fracture of left tibia, subsequent encounter for closed fracture with malunion: Secondary | ICD-10-CM | POA: Diagnosis not present

## 2016-12-25 DIAGNOSIS — S82872A Displaced pilon fracture of left tibia, initial encounter for closed fracture: Secondary | ICD-10-CM | POA: Diagnosis not present

## 2016-12-25 DIAGNOSIS — S82892A Other fracture of left lower leg, initial encounter for closed fracture: Secondary | ICD-10-CM | POA: Diagnosis not present

## 2016-12-25 DIAGNOSIS — S82842A Displaced bimalleolar fracture of left lower leg, initial encounter for closed fracture: Secondary | ICD-10-CM | POA: Diagnosis not present

## 2017-01-10 DIAGNOSIS — G8929 Other chronic pain: Secondary | ICD-10-CM | POA: Diagnosis not present

## 2017-01-10 DIAGNOSIS — M545 Low back pain: Secondary | ICD-10-CM | POA: Diagnosis not present

## 2017-01-10 DIAGNOSIS — M79605 Pain in left leg: Secondary | ICD-10-CM | POA: Diagnosis not present

## 2017-01-10 DIAGNOSIS — I10 Essential (primary) hypertension: Secondary | ICD-10-CM | POA: Diagnosis not present

## 2017-02-06 DIAGNOSIS — S82492D Other fracture of shaft of left fibula, subsequent encounter for closed fracture with routine healing: Secondary | ICD-10-CM | POA: Diagnosis not present

## 2017-02-21 ENCOUNTER — Ambulatory Visit
Admission: RE | Admit: 2017-02-21 | Discharge: 2017-02-21 | Disposition: A | Discharge status: Home / Self Care | Payer: Self-pay | Source: Ambulatory Visit | Attending: Pulmonary Disease | Admitting: Pulmonary Disease

## 2017-02-21 ENCOUNTER — Other Ambulatory Visit: Payer: Self-pay

## 2017-02-21 DIAGNOSIS — S99912A Unspecified injury of left ankle, initial encounter: Secondary | ICD-10-CM

## 2017-03-11 DIAGNOSIS — M25572 Pain in left ankle and joints of left foot: Secondary | ICD-10-CM | POA: Diagnosis not present

## 2017-03-11 DIAGNOSIS — S82872D Displaced pilon fracture of left tibia, subsequent encounter for closed fracture with routine healing: Secondary | ICD-10-CM | POA: Diagnosis not present

## 2017-04-05 DIAGNOSIS — M25572 Pain in left ankle and joints of left foot: Secondary | ICD-10-CM | POA: Diagnosis not present

## 2017-04-05 DIAGNOSIS — S82872K Displaced pilon fracture of left tibia, subsequent encounter for closed fracture with nonunion: Secondary | ICD-10-CM | POA: Diagnosis not present

## 2017-04-10 DIAGNOSIS — E7800 Pure hypercholesterolemia, unspecified: Secondary | ICD-10-CM | POA: Diagnosis not present

## 2017-04-10 DIAGNOSIS — Z720 Tobacco use: Secondary | ICD-10-CM | POA: Diagnosis not present

## 2017-04-10 DIAGNOSIS — F17200 Nicotine dependence, unspecified, uncomplicated: Secondary | ICD-10-CM | POA: Diagnosis not present

## 2017-04-10 DIAGNOSIS — J209 Acute bronchitis, unspecified: Secondary | ICD-10-CM | POA: Diagnosis not present

## 2017-04-10 DIAGNOSIS — Z79899 Other long term (current) drug therapy: Secondary | ICD-10-CM | POA: Diagnosis not present

## 2017-04-10 DIAGNOSIS — I10 Essential (primary) hypertension: Secondary | ICD-10-CM | POA: Diagnosis not present

## 2017-04-10 DIAGNOSIS — J449 Chronic obstructive pulmonary disease, unspecified: Secondary | ICD-10-CM | POA: Diagnosis not present

## 2017-04-10 DIAGNOSIS — R05 Cough: Secondary | ICD-10-CM | POA: Diagnosis not present

## 2017-04-16 DIAGNOSIS — S82872K Displaced pilon fracture of left tibia, subsequent encounter for closed fracture with nonunion: Secondary | ICD-10-CM | POA: Diagnosis not present

## 2017-04-25 DIAGNOSIS — M19072 Primary osteoarthritis, left ankle and foot: Secondary | ICD-10-CM | POA: Diagnosis not present

## 2017-04-25 DIAGNOSIS — M5440 Lumbago with sciatica, unspecified side: Secondary | ICD-10-CM | POA: Diagnosis not present

## 2017-04-25 DIAGNOSIS — Z5181 Encounter for therapeutic drug level monitoring: Secondary | ICD-10-CM | POA: Diagnosis not present

## 2017-04-25 DIAGNOSIS — I10 Essential (primary) hypertension: Secondary | ICD-10-CM | POA: Diagnosis not present

## 2017-05-15 DIAGNOSIS — Z125 Encounter for screening for malignant neoplasm of prostate: Secondary | ICD-10-CM | POA: Diagnosis not present

## 2017-05-15 DIAGNOSIS — M5440 Lumbago with sciatica, unspecified side: Secondary | ICD-10-CM | POA: Diagnosis not present

## 2017-05-15 DIAGNOSIS — I10 Essential (primary) hypertension: Secondary | ICD-10-CM | POA: Diagnosis not present

## 2017-05-15 DIAGNOSIS — Z0000 Encounter for general adult medical examination without abnormal findings: Secondary | ICD-10-CM | POA: Diagnosis not present

## 2017-05-15 DIAGNOSIS — E7800 Pure hypercholesterolemia, unspecified: Secondary | ICD-10-CM | POA: Diagnosis not present

## 2017-07-05 DIAGNOSIS — S82872K Displaced pilon fracture of left tibia, subsequent encounter for closed fracture with nonunion: Secondary | ICD-10-CM | POA: Diagnosis not present

## 2017-07-05 DIAGNOSIS — M25572 Pain in left ankle and joints of left foot: Secondary | ICD-10-CM | POA: Diagnosis not present

## 2017-07-05 DIAGNOSIS — M545 Low back pain: Secondary | ICD-10-CM | POA: Diagnosis not present

## 2017-07-15 DIAGNOSIS — M533 Sacrococcygeal disorders, not elsewhere classified: Secondary | ICD-10-CM | POA: Diagnosis not present

## 2017-09-02 DIAGNOSIS — M19072 Primary osteoarthritis, left ankle and foot: Secondary | ICD-10-CM | POA: Diagnosis not present

## 2017-09-19 DIAGNOSIS — Z125 Encounter for screening for malignant neoplasm of prostate: Secondary | ICD-10-CM | POA: Diagnosis not present

## 2017-09-19 DIAGNOSIS — M5440 Lumbago with sciatica, unspecified side: Secondary | ICD-10-CM | POA: Diagnosis not present

## 2017-09-19 DIAGNOSIS — I10 Essential (primary) hypertension: Secondary | ICD-10-CM | POA: Diagnosis not present

## 2017-09-19 DIAGNOSIS — Z0000 Encounter for general adult medical examination without abnormal findings: Secondary | ICD-10-CM | POA: Diagnosis not present

## 2017-09-19 DIAGNOSIS — Z5181 Encounter for therapeutic drug level monitoring: Secondary | ICD-10-CM | POA: Diagnosis not present

## 2017-09-26 DIAGNOSIS — Z0000 Encounter for general adult medical examination without abnormal findings: Secondary | ICD-10-CM | POA: Diagnosis not present

## 2017-09-26 DIAGNOSIS — I10 Essential (primary) hypertension: Secondary | ICD-10-CM | POA: Diagnosis not present

## 2017-09-26 DIAGNOSIS — C4491 Basal cell carcinoma of skin, unspecified: Secondary | ICD-10-CM | POA: Diagnosis not present

## 2017-09-26 DIAGNOSIS — E7800 Pure hypercholesterolemia, unspecified: Secondary | ICD-10-CM | POA: Diagnosis not present

## 2017-09-26 DIAGNOSIS — M545 Low back pain: Secondary | ICD-10-CM | POA: Diagnosis not present

## 2017-10-18 DIAGNOSIS — C44319 Basal cell carcinoma of skin of other parts of face: Secondary | ICD-10-CM | POA: Diagnosis not present

## 2017-10-18 DIAGNOSIS — D225 Melanocytic nevi of trunk: Secondary | ICD-10-CM | POA: Diagnosis not present

## 2017-10-18 DIAGNOSIS — C44311 Basal cell carcinoma of skin of nose: Secondary | ICD-10-CM | POA: Diagnosis not present

## 2018-01-22 DIAGNOSIS — I10 Essential (primary) hypertension: Secondary | ICD-10-CM | POA: Diagnosis not present

## 2018-01-22 DIAGNOSIS — M545 Low back pain: Secondary | ICD-10-CM | POA: Diagnosis not present

## 2018-01-27 DIAGNOSIS — G8929 Other chronic pain: Secondary | ICD-10-CM | POA: Diagnosis not present

## 2018-01-27 DIAGNOSIS — M5440 Lumbago with sciatica, unspecified side: Secondary | ICD-10-CM | POA: Diagnosis not present

## 2018-01-27 DIAGNOSIS — F419 Anxiety disorder, unspecified: Secondary | ICD-10-CM | POA: Diagnosis not present

## 2018-01-27 DIAGNOSIS — I10 Essential (primary) hypertension: Secondary | ICD-10-CM | POA: Diagnosis not present

## 2018-01-27 DIAGNOSIS — E7800 Pure hypercholesterolemia, unspecified: Secondary | ICD-10-CM | POA: Diagnosis not present

## 2018-03-03 DIAGNOSIS — M5136 Other intervertebral disc degeneration, lumbar region: Secondary | ICD-10-CM | POA: Diagnosis not present

## 2018-03-03 DIAGNOSIS — M129 Arthropathy, unspecified: Secondary | ICD-10-CM | POA: Diagnosis not present

## 2018-03-03 DIAGNOSIS — Z79899 Other long term (current) drug therapy: Secondary | ICD-10-CM | POA: Diagnosis not present

## 2018-03-17 DIAGNOSIS — M5136 Other intervertebral disc degeneration, lumbar region: Secondary | ICD-10-CM | POA: Diagnosis not present

## 2018-03-17 DIAGNOSIS — G894 Chronic pain syndrome: Secondary | ICD-10-CM | POA: Diagnosis not present

## 2018-03-17 DIAGNOSIS — Z79899 Other long term (current) drug therapy: Secondary | ICD-10-CM | POA: Diagnosis not present

## 2018-03-17 DIAGNOSIS — F1120 Opioid dependence, uncomplicated: Secondary | ICD-10-CM | POA: Diagnosis not present

## 2018-05-05 DIAGNOSIS — G894 Chronic pain syndrome: Secondary | ICD-10-CM | POA: Diagnosis not present

## 2018-05-05 DIAGNOSIS — M5136 Other intervertebral disc degeneration, lumbar region: Secondary | ICD-10-CM | POA: Diagnosis not present

## 2018-05-05 DIAGNOSIS — Z79899 Other long term (current) drug therapy: Secondary | ICD-10-CM | POA: Diagnosis not present

## 2018-05-05 DIAGNOSIS — F1120 Opioid dependence, uncomplicated: Secondary | ICD-10-CM | POA: Diagnosis not present

## 2018-07-04 DIAGNOSIS — Z79899 Other long term (current) drug therapy: Secondary | ICD-10-CM | POA: Diagnosis not present

## 2018-07-04 DIAGNOSIS — G894 Chronic pain syndrome: Secondary | ICD-10-CM | POA: Diagnosis not present

## 2018-07-04 DIAGNOSIS — F1120 Opioid dependence, uncomplicated: Secondary | ICD-10-CM | POA: Diagnosis not present

## 2018-07-04 DIAGNOSIS — M5136 Other intervertebral disc degeneration, lumbar region: Secondary | ICD-10-CM | POA: Diagnosis not present

## 2018-08-28 DIAGNOSIS — M19072 Primary osteoarthritis, left ankle and foot: Secondary | ICD-10-CM | POA: Diagnosis not present

## 2018-11-25 ENCOUNTER — Other Ambulatory Visit: Payer: Self-pay | Admitting: Student

## 2018-11-25 DIAGNOSIS — M25572 Pain in left ankle and joints of left foot: Secondary | ICD-10-CM

## 2018-12-03 ENCOUNTER — Ambulatory Visit
Admission: RE | Admit: 2018-12-03 | Discharge: 2018-12-03 | Disposition: A | Discharge status: Home / Self Care | Payer: Medicare HMO | Source: Ambulatory Visit | Attending: Student | Admitting: Student

## 2018-12-03 DIAGNOSIS — M25572 Pain in left ankle and joints of left foot: Secondary | ICD-10-CM

## 2019-08-21 IMAGING — CR DG ANKLE COMPLETE 3+V*L*
3 series · 3 of 3 positions shown · non-contrast
Comparison: None.

CLINICAL DATA: Pt reports missing a step today and fell onto left
side, injuring left foot and ankle. Significant swelling noted

EXAM:
LEFT ANKLE COMPLETE - 3+ VIEW

[ankle ap]
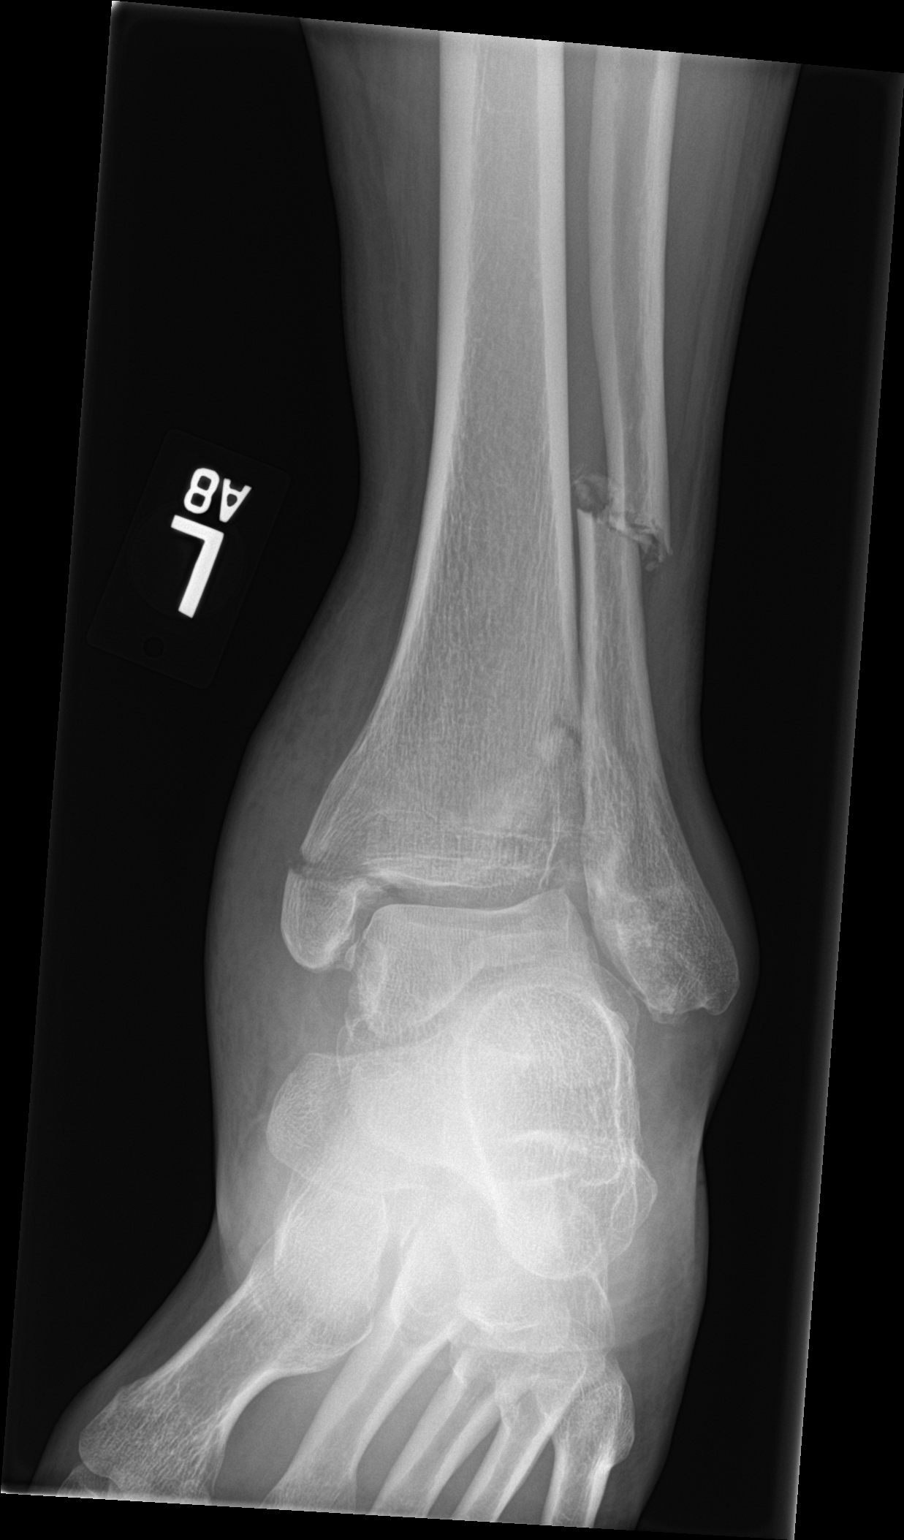

[ankle obl]
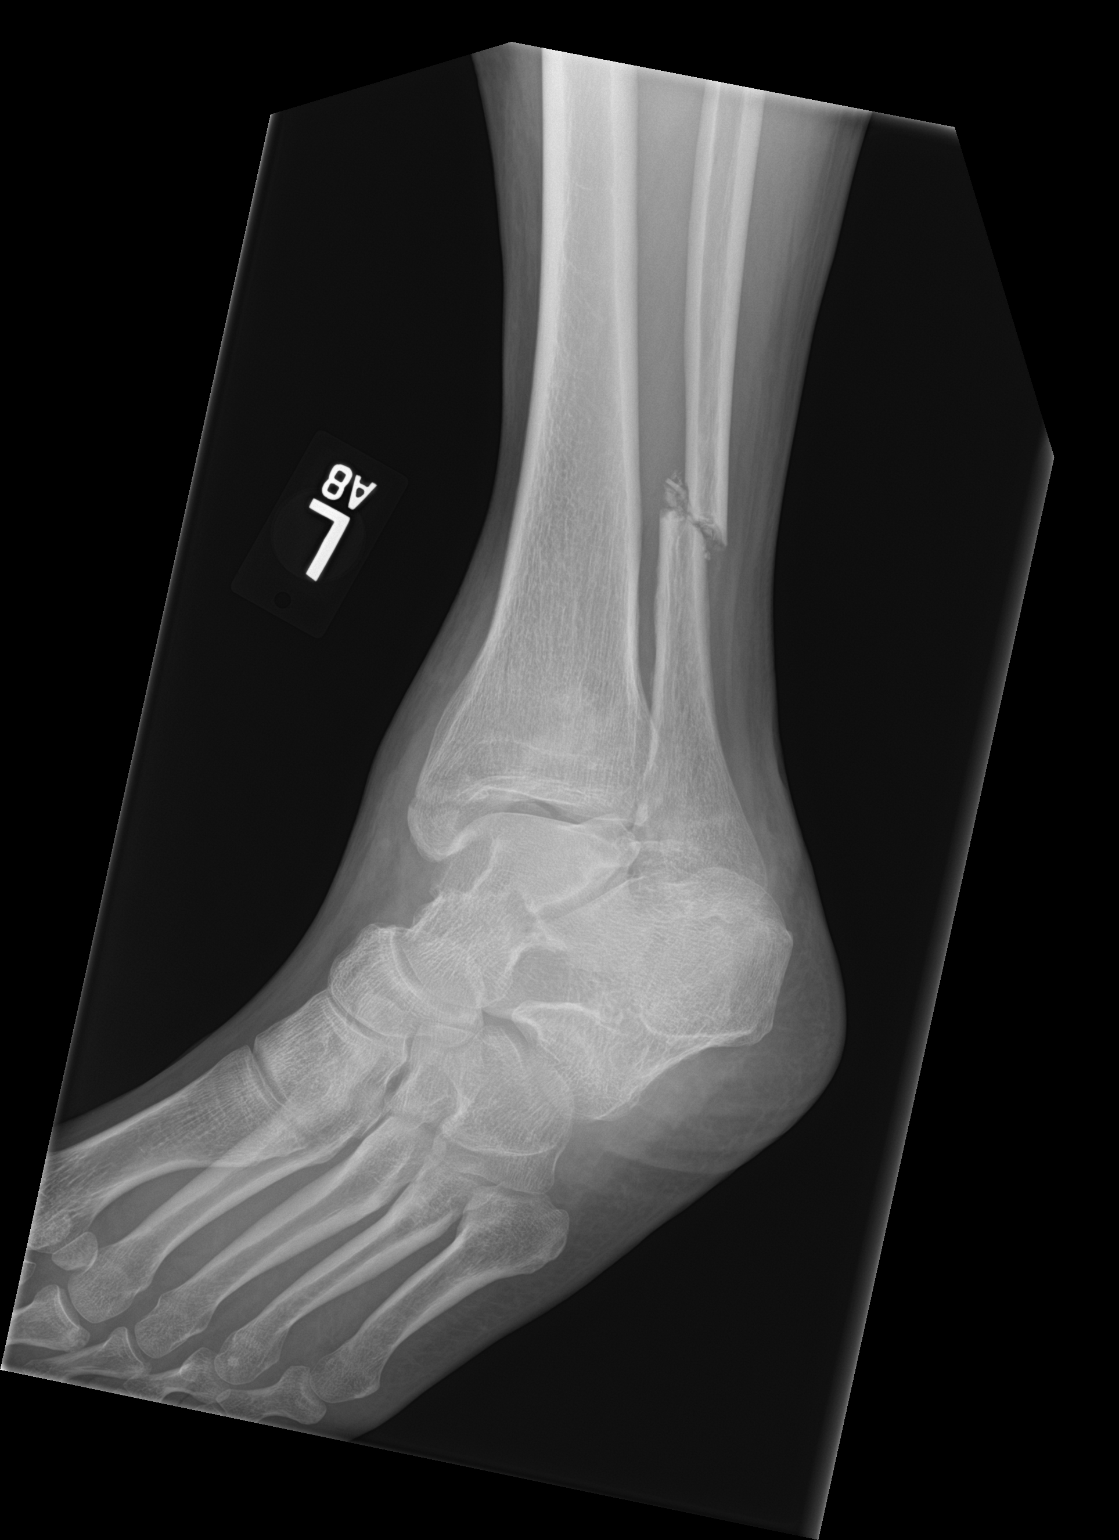

[ankle lat]
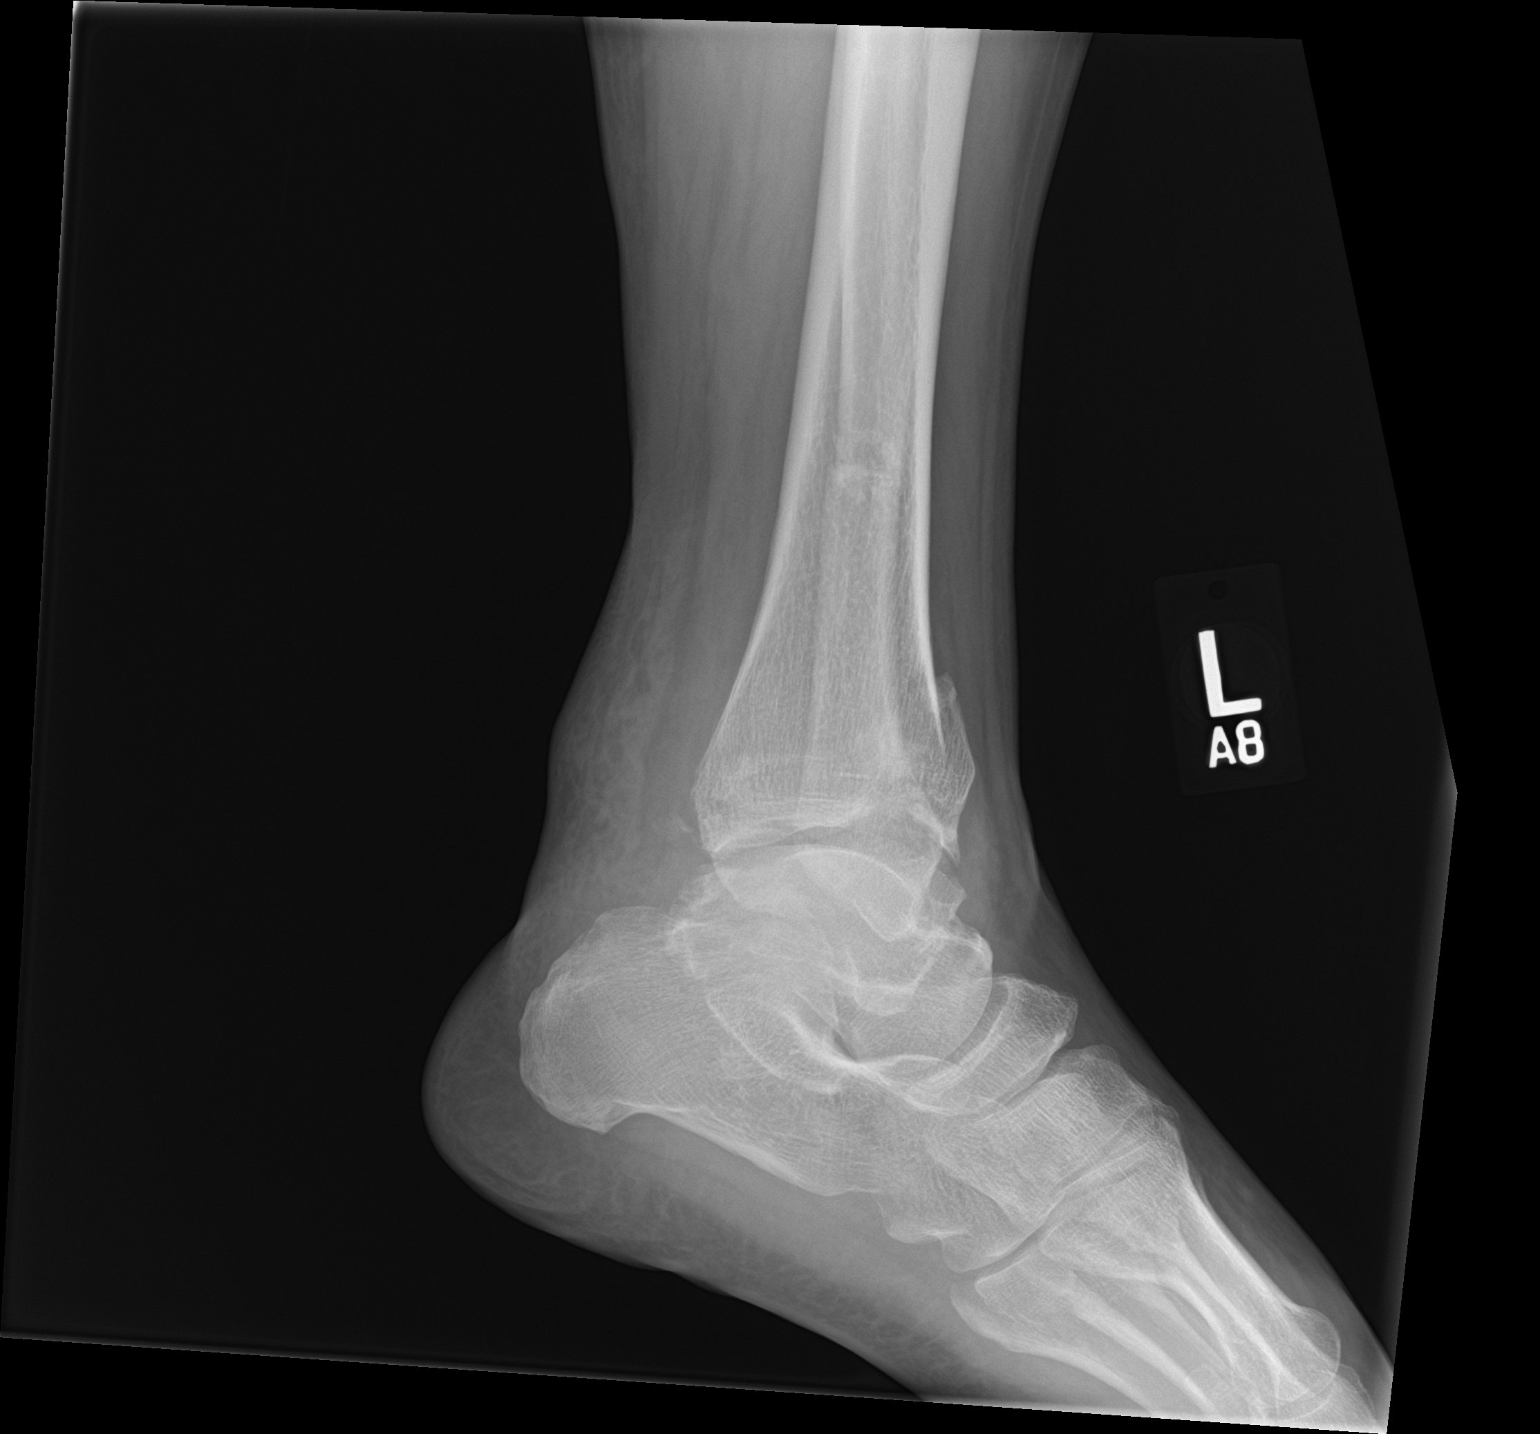

[3 of 3 positions shown; findings below may reference images not displayed]

FINDINGS: There is a mildly displaced fracture of the distal fibular diaphysis
with 4 mm of medial displacement. There is a transverse fracture of
the medial malleolus without significant displacement. There is no
other fracture or dislocation. Ankle mortise is intact. There is no
evidence of arthropathy or other focal bone abnormality. Soft tissue
swelling overlying the medial malleolus. Soft tissue swelling over
the distal fibular diaphysis.
IMPRESSION: Mildly displaced fracture of the distal fibular diaphysis.

Transverse fracture of the medial malleolus.

## 2023-02-25 NOTE — Progress Notes (Signed)
 GI MEDICAL ONCOLOGY NOTE Date:02/25/2023   Location Information: Patient State (at time of visit): [place]  Patient Location (at time of visit):Home/Other Non-Medical  Provider Location: Hospital/Provider-Based Clinic Is provider licensed to provide clinical care in the current location/state of the patient? Yes   Consent:  Patient's identity was confirmed. Presenting condition or illness was discussed with the patient/personal representative. Current proposed treatment for presenting condition or illness was explained to patient/personal representative along with the likely benefits and any significant risks or complications associated with the provision of treatment by audio/video means. The patient/personal representative verbally authorized treatment to be provided by audio/video, which may include a limited review of patient's current health status, medication, or other treatment recommendations, patient education, and an opportunity to ask questions about condition and treatment. 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 -CapOx 06/07/20-06/30/20 (2 cycles, 25% dose reduced on cycle 2 due to diarrhea) - FOLFOX 07/21/20-11/03/20 (5 cycles, treatment discontinued due to patient preference)  CURRENT TREATMENT: Surveillance   ONCOLOGIC HISTORY: James Castro is a 57 y.o. male patient with a history of degenerative disc disease, hypertension, heart valve disorder, and current smoker newly diagnosed with Stage III pT4a pN1a cecum adenocarcinoma. He initially presented to the emergency deprtment with right sided abdominal pain. CT Abdomen Pelvis demonstrated positive for lobulated and enhancing soft tissue mass at the cecum measuring up to 5.4 cm. Colonoscopy  on 03/22/20 demonstrated a likely malignant partially obstructing tumor at the ileocecal valve/cecum that was biopsied. Attempts to enter the cecum were unsuccessful due to the mass. He underwent a laparoscopic right hemicolectomy on 04/25/20. This showed a pT4a pN1a adenocarcinoma. Imaging done after his surgery on 05/16/20 showed some questionable liver lesions. MRI showing no evidence of disease.   TODAY'S VISIT:  [name] returns via phone visit for surveillance follow up. He reports good energy level and continues to work daily without restrictions. He is eating well and denies nausea or vomiting. He is having regular bowel movements and denies hematochezia or melena. He denies fevers, chills, chest pain, cough, shortness of breath,or abdominal pain. He denies any other complaints today.    PAST MEDICAL HISTORY: Past Medical History:  Diagnosis Date  . Arthritis   . Hypertension      PAST SURGICAL HISTORY: Past Surgical History:  Procedure Laterality Date  . APPENDECTOMY     Procedure: APPENDECTOMY  . FOOT FRACTURE SURGERY Left    Procedure: FOOT FRACTURE SURGERY  . HEMICOLECTOMY Right 04/25/2020   Procedure: RIGHT HEMICOLECTOMY LAPAROSCOPIC;  Surgeon: Cy Marcheta Brochure, MD;  Location: [facility] MAIN OR;  Service: General;  Laterality: Right;  TAP blocks  . NECK SURGERY     Procedure: NECK SURGERY     ALLERGIES Allergies  Allergen Reactions  . Azithromycin Other (See Comments)     CURRENT MEDICATIONS Current Outpatient Medications  Medication Instructions  . losartan (COZAAR) 50 mg, Daily  . oxyCODONE-acetaminophen (PERCOCET) 10-325 mg per tablet 1 tablet, 6 times daily  . oxymetazoline (AFRIN) 0.05 % nasal spray 2 sprays, 2 times daily PRN  . PARoxetine (PAXIL) 30 mg, oral, Daily  . tadalafiL (CIALIS) 20 mg, oral, Daily PRN     FAMILY HISTORY Family History  Problem Relation Name Age of Onset  . Arthritis Mother    . Diabetes Father    .  Hypertension Father    . Stroke  Father       SOCIAL HISTORY Social History   Socioeconomic History  . Marital status: Married    Spouse name: Not on file  . Number of children: Not on file  . Years of education: Not on file  . Highest education level: Not on file  Occupational History  . Not on file  Tobacco Use  . Smoking status: Some Days    Current packs/day: 1.00    Types: Cigarettes  . Smokeless tobacco: Never  Substance and Sexual Activity  . Alcohol use: Never  . Drug use: Never  . Sexual activity: Not on file  Other Topics Concern  . Not on file  Social History Narrative   ** Merged History Encounter **       Social Drivers of Health   Food Insecurity: Not on file  Transportation Needs: Not on file  Safety: Not on file  Living Situation: Not on file     Review of systems: 12 point ROS obtained and negative if not mentioned above.    PHYSICAL EXAMINATION: Unable to perform, phone visit  LABS: No results found for this or any previous visit (from the past 240 hours).  Tumor marker pattern over time has been the following:  Lab Results  Component Value Date   CEA 1.9 07/19/2022   CEA 1.8 01/30/2022   CEA 2.1 07/24/2021   CEA 2.0 04/27/2021   CEA 1.9 01/12/2021   CEA 1.8 10/20/2020   CEA 2.6 09/01/2020   CEA 2.0 07/21/2020   CEA 1.8 06/30/2020   CEA 1.8 06/07/2020   PSA 0.22 08/25/2021       RADIOLOGY: Radiology Results (last 14 days)     Procedure Component Value Units Date/Time   CT Chest Abdomen Pelvis W Contrast [[id number]] Collected: 02/22/23 0847   Order Status: Completed Updated: 02/22/23 1212   Narrative:     CT CHEST ABDOMEN PELVIS W CONTRAST, 02/22/2023 8:40 AM  INDICATION:  colon cancer assess treatment response, Colon cancer, stage II/III, monitor, Malignant neoplasm of cecum (CMD) \ C18.0 Malignant neoplasm of cecum (CMD), colon cancer assess treatment response COMPARISON: Multiple prior CTs, most recent CT chest, abdomen and pelvis 07/19/2022.  TECHNIQUE: CT  images of the chest, abdomen, and pelvis were obtained following intravenous administration of iodinated contrast. Conventional axial reconstructions and multiplanar reformatted images were submitted for review.    FINDINGS:  . Thoracic inlet: Within normal limits. . Chest wall/Axilla: Within normal limits. . Central airways: Patent. . Mediastinum/Hila: Unremarkable. SABRA Heart: Heart size within normal limits. No pericardial effusion. Coronary artery and mitral annular calcifications. . Vessels: Aorta normal in caliber. No central pulmonary embolism. . Lungs/Pleura: Similar bilateral apical scarring. Similar mild centrilobular emphysema. No large airspace consolidation or pleural effusion. No new pulmonary nodules.  . Liver: No suspicious focal findings. Mild hepatomegaly at 20.5 cm length. Fatty infiltration adjacent to falciform. . Gallbladder/Biliary: Decompressed. No intrahepatic or extrahepatic biliary ductal dilation. SABRA Spleen: Similar subcentimeter hypodensity (series 2, image 128). Similar 5 mm soft tissue density along the superior aspect of the spleen (series 2, image 117), likely representing a splenule. . Pancreas: Unremarkable. . Adrenals: Unremarkable. . Kidneys/ureters: Symmetric renal enhancement. No hydronephrosis.  . Peritoneum/Mesenteries/Extraperitoneum: No free air. No free fluid or loculated drainable collection. Stable 7 mm left retroperitoneal lymph node (series 2, image 134). Similar appearance of subcentimeter lymph nodes in the right lower quadrant adjacent postsurgical site (annotated images in series 2). . Gastrointestinal  tract: Postsurgical changes of right hemicolectomy. No evidence of obstruction.  . Bladder: Unremarkable. . Reproductive System: Unremarkable.  . Vascular: Diffuse calcified atherosclerotic plaques in the abdominal aorta and common iliac arteries. No aortic aneurysm. . Musculoskeletal: No acute displaced fractures. Cervical spine fusion  hardware. Multilevel degenerative changes of the thoracolumbar spine. No aggressive focal bony lesions. Postsurgical changes of the ventral abdominal wall with diastasis recti.    Impression:     1.  Redemonstrated changes of right hemicolectomy without evidence of metastatic disease in the abdomen and pelvis. 2.  Ancillary findings as above.         PATHOLOGY:  SMALL BOWEL, RIGHT COLON AND ABDOMINAL SIDE WALL, RESECTION:               Invasive adenocarcinoma of the cucum, moderately differentiated.              Tumor size: 4.1 cm in greatest dimension.              Carcinoma invades through visceral peritoneum.              Margins are negative for invasive carcinoma.              Lymphovascular invasion is identified.              One lymph node of nineteen is positive for metastatic carcinoma (1/19).              Pathologic stage: pT4a pN1a   -Retained nuclear expression of MLH1, PMS2, MSH2, and MSH6.   -Based on this finding, there is no evidence of DNA mismatch repair gene deficiency (MMR proficient).   ASSESSMENT AND PLAN:  Stanly Si is a 57 y.o. male presenting with stage III cecum adenocarcinoma for medical oncology evaluation.  1. Stage III Cecum Adenocarcinoma I discussed the natural history of colon cancer with the patient and his family, focusing on his case in particular. We reviewed the imaging, pathology, and lab studies at length. There was concern for stage IV disease, but MRI liver ruled out liver lesions.  Adjuvant CAPEOX recommended per NCCN guidelines, initiated 06/07/20. Cycle 2 was dose reduced by 25% of the capecitabine.  He will take 3 pills in the morning and 3 pills in the evening.  Reviewed with him recommendations to take cholecyst I remain to add bulk to his stools and can use as needed Lomotil. Diarrhea was still poorly controlled. He stopped CAPEOX and transitioned to FOLFOX 07/21/20, planned for 6 months of treatment followed with restaging  scans. He had 4 total cycles of treatment, then stopped for 2 months on his own accord. After 5th cycle FOLFOX patient declined anymore treatment against recommendations. CT CAP on 01/12/21 demonstrated no evidence of disease and he was started on surveillance.   He presents to the clinic for surveillance visit today, 07/27/2021.  - CT without findings of recurrence - Labs reviewed with no interventions, CEA WNL - RTC in 1 year for labs, MD visit, and CT scan review     By the end of the visit, I believe I answered all the patient's questions and concerns. Contact information for the cancer center was given and he knows to call if any concerns arise.

## 2023-12-30 ENCOUNTER — Inpatient Hospital Stay (HOSPITAL_COMMUNITY)

## 2023-12-30 ENCOUNTER — Inpatient Hospital Stay (HOSPITAL_COMMUNITY)
Admission: EM | Admit: 2023-12-30 | Discharge: 2024-01-02 | DRG: 280 | Discharge status: Against Medical Advice | Attending: Internal Medicine | Admitting: Internal Medicine

## 2023-12-30 ENCOUNTER — Emergency Department (HOSPITAL_COMMUNITY)

## 2023-12-30 DIAGNOSIS — D72829 Elevated white blood cell count, unspecified: Secondary | ICD-10-CM | POA: Diagnosis not present

## 2023-12-30 DIAGNOSIS — E872 Acidosis, unspecified: Secondary | ICD-10-CM | POA: Diagnosis present

## 2023-12-30 DIAGNOSIS — Z5320 Procedure and treatment not carried out because of patient's decision for unspecified reasons: Secondary | ICD-10-CM | POA: Diagnosis not present

## 2023-12-30 DIAGNOSIS — N179 Acute kidney failure, unspecified: Secondary | ICD-10-CM | POA: Diagnosis present

## 2023-12-30 DIAGNOSIS — I472 Ventricular tachycardia, unspecified: Secondary | ICD-10-CM | POA: Diagnosis present

## 2023-12-30 DIAGNOSIS — E781 Pure hyperglyceridemia: Secondary | ICD-10-CM | POA: Diagnosis present

## 2023-12-30 DIAGNOSIS — K7200 Acute and subacute hepatic failure without coma: Secondary | ICD-10-CM | POA: Diagnosis present

## 2023-12-30 DIAGNOSIS — R739 Hyperglycemia, unspecified: Secondary | ICD-10-CM | POA: Diagnosis not present

## 2023-12-30 DIAGNOSIS — I469 Cardiac arrest, cause unspecified: Secondary | ICD-10-CM | POA: Diagnosis present

## 2023-12-30 DIAGNOSIS — R197 Diarrhea, unspecified: Secondary | ICD-10-CM | POA: Diagnosis present

## 2023-12-30 DIAGNOSIS — E7800 Pure hypercholesterolemia, unspecified: Secondary | ICD-10-CM | POA: Diagnosis present

## 2023-12-30 DIAGNOSIS — I214 Non-ST elevation (NSTEMI) myocardial infarction: Principal | ICD-10-CM

## 2023-12-30 DIAGNOSIS — I4901 Ventricular fibrillation: Secondary | ICD-10-CM | POA: Diagnosis present

## 2023-12-30 DIAGNOSIS — I10 Essential (primary) hypertension: Secondary | ICD-10-CM | POA: Diagnosis not present

## 2023-12-30 DIAGNOSIS — R570 Cardiogenic shock: Secondary | ICD-10-CM | POA: Diagnosis present

## 2023-12-30 DIAGNOSIS — J449 Chronic obstructive pulmonary disease, unspecified: Secondary | ICD-10-CM | POA: Diagnosis present

## 2023-12-30 DIAGNOSIS — J9601 Acute respiratory failure with hypoxia: Secondary | ICD-10-CM | POA: Diagnosis present

## 2023-12-30 DIAGNOSIS — I5021 Acute systolic (congestive) heart failure: Secondary | ICD-10-CM | POA: Diagnosis present

## 2023-12-30 DIAGNOSIS — I462 Cardiac arrest due to underlying cardiac condition: Secondary | ICD-10-CM | POA: Diagnosis present

## 2023-12-30 DIAGNOSIS — I2109 ST elevation (STEMI) myocardial infarction involving other coronary artery of anterior wall: Secondary | ICD-10-CM

## 2023-12-30 DIAGNOSIS — I2510 Atherosclerotic heart disease of native coronary artery without angina pectoris: Secondary | ICD-10-CM | POA: Diagnosis present

## 2023-12-30 DIAGNOSIS — I110 Hypertensive heart disease with heart failure: Secondary | ICD-10-CM | POA: Diagnosis present

## 2023-12-30 DIAGNOSIS — G9341 Metabolic encephalopathy: Secondary | ICD-10-CM | POA: Diagnosis present

## 2023-12-30 DIAGNOSIS — Z79899 Other long term (current) drug therapy: Secondary | ICD-10-CM

## 2023-12-30 DIAGNOSIS — Z79891 Long term (current) use of opiate analgesic: Secondary | ICD-10-CM

## 2023-12-30 DIAGNOSIS — I3139 Other pericardial effusion (noninflammatory): Secondary | ICD-10-CM | POA: Diagnosis present

## 2023-12-30 DIAGNOSIS — I255 Ischemic cardiomyopathy: Secondary | ICD-10-CM | POA: Diagnosis present

## 2023-12-30 DIAGNOSIS — Z9049 Acquired absence of other specified parts of digestive tract: Secondary | ICD-10-CM

## 2023-12-30 DIAGNOSIS — Z85038 Personal history of other malignant neoplasm of large intestine: Secondary | ICD-10-CM

## 2023-12-30 DIAGNOSIS — Z781 Physical restraint status: Secondary | ICD-10-CM

## 2023-12-30 DIAGNOSIS — R569 Unspecified convulsions: Secondary | ICD-10-CM | POA: Diagnosis not present

## 2023-12-30 DIAGNOSIS — R7401 Elevation of levels of liver transaminase levels: Secondary | ICD-10-CM | POA: Diagnosis not present

## 2023-12-30 DIAGNOSIS — R410 Disorientation, unspecified: Secondary | ICD-10-CM

## 2023-12-30 DIAGNOSIS — F05 Delirium due to known physiological condition: Secondary | ICD-10-CM | POA: Diagnosis not present

## 2023-12-30 DIAGNOSIS — F17210 Nicotine dependence, cigarettes, uncomplicated: Secondary | ICD-10-CM | POA: Diagnosis present

## 2023-12-30 DIAGNOSIS — E876 Hypokalemia: Secondary | ICD-10-CM | POA: Diagnosis present

## 2023-12-30 LAB — LIPID PANEL
Cholesterol: 173 mg/dL (ref 0–200)
HDL: 28 mg/dL — ABNORMAL LOW (ref >40)
LDL Cholesterol: 111 mg/dL — ABNORMAL HIGH (ref 0–99)
Total CHOL/HDL Ratio: 6.2 ratio
Triglycerides: 170 mg/dL — ABNORMAL HIGH (ref <150)
VLDL: 34 mg/dL (ref 0–40)

## 2023-12-30 LAB — CBC WITH DIFFERENTIAL/PLATELET
Abs Immature Granulocytes: 0.2 K/uL — ABNORMAL HIGH (ref 0.00–0.07)
Basophils Absolute: 0.1 K/uL (ref 0.0–0.1)
Basophils Relative: 0 %
Eosinophils Absolute: 0.3 K/uL (ref 0.0–0.5)
Eosinophils Relative: 2 %
HCT: 46.1 % (ref 39.0–52.0)
Hemoglobin: 14.6 g/dL (ref 13.0–17.0)
Immature Granulocytes: 1 %
Lymphocytes Relative: 35 %
Lymphs Abs: 5.5 K/uL — ABNORMAL HIGH (ref 0.7–4.0)
MCH: 30.4 pg (ref 26.0–34.0)
MCHC: 31.7 g/dL (ref 30.0–36.0)
MCV: 96 fL (ref 80.0–100.0)
Monocytes Absolute: 0.4 K/uL (ref 0.1–1.0)
Monocytes Relative: 3 %
Neutro Abs: 9.3 K/uL — ABNORMAL HIGH (ref 1.7–7.7)
Neutrophils Relative %: 59 %
Platelets: 268 K/uL (ref 150–400)
RBC: 4.8 MIL/uL (ref 4.22–5.81)
RDW: 12.9 % (ref 11.5–15.5)
WBC: 15.7 K/uL — ABNORMAL HIGH (ref 4.0–10.5)
nRBC: 0 % (ref 0.0–0.2)

## 2023-12-30 LAB — COMPREHENSIVE METABOLIC PANEL WITH GFR
ALT: 132 U/L — ABNORMAL HIGH (ref 0–44)
ALT: 126 U/L — ABNORMAL HIGH (ref 0–44)
AST: 129 U/L — ABNORMAL HIGH (ref 15–41)
AST: 187 U/L — ABNORMAL HIGH (ref 15–41)
Albumin: 2.9 g/dL — ABNORMAL LOW (ref 3.5–5.0)
Albumin: 3 g/dL — ABNORMAL LOW (ref 3.5–5.0)
Alkaline Phosphatase: 92 U/L (ref 38–126)
Alkaline Phosphatase: 88 U/L (ref 38–126)
Anion gap: 17 — ABNORMAL HIGH (ref 5–15)
Anion gap: 10 (ref 5–15)
BUN: 14 mg/dL (ref 6–20)
BUN: 18 mg/dL (ref 6–20)
CO2: 16 mmol/L — ABNORMAL LOW (ref 22–32)
CO2: 22 mmol/L (ref 22–32)
Calcium: 7.8 mg/dL — ABNORMAL LOW (ref 8.9–10.3)
Calcium: 7.8 mg/dL — ABNORMAL LOW (ref 8.9–10.3)
Chloride: 107 mmol/L (ref 98–111)
Chloride: 108 mmol/L (ref 98–111)
Creatinine, Ser: 1.36 mg/dL — ABNORMAL HIGH (ref 0.61–1.24)
Creatinine, Ser: 1.2 mg/dL (ref 0.61–1.24)
GFR, Estimated: >60 mL/min (ref >60)
GFR, Estimated: >60 mL/min (ref >60)
Glucose, Bld: 259 mg/dL — ABNORMAL HIGH (ref 70–99)
Glucose, Bld: 140 mg/dL — ABNORMAL HIGH (ref 70–99)
Potassium: 3.5 mmol/L (ref 3.5–5.1)
Potassium: 4.3 mmol/L (ref 3.5–5.1)
Sodium: 140 mmol/L (ref 135–145)
Sodium: 140 mmol/L (ref 135–145)
Total Bilirubin: 0.6 mg/dL (ref 0.0–1.2)
Total Bilirubin: 0.6 mg/dL (ref 0.0–1.2)
Total Protein: 5.3 g/dL — ABNORMAL LOW (ref 6.5–8.1)
Total Protein: 5.6 g/dL — ABNORMAL LOW (ref 6.5–8.1)

## 2023-12-30 LAB — I-STAT CHEM 8, ED
BUN: 15 mg/dL (ref 6–20)
Calcium, Ion: 1.13 mmol/L — ABNORMAL LOW (ref 1.15–1.40)
Chloride: 105 mmol/L (ref 98–111)
Creatinine, Ser: 1.2 mg/dL (ref 0.61–1.24)
Glucose, Bld: 245 mg/dL — ABNORMAL HIGH (ref 70–99)
HCT: 44 % (ref 39.0–52.0)
Hemoglobin: 15 g/dL (ref 13.0–17.0)
Potassium: 3.6 mmol/L (ref 3.5–5.1)
Sodium: 143 mmol/L (ref 135–145)
TCO2: 20 mmol/L — ABNORMAL LOW (ref 22–32)

## 2023-12-30 LAB — ECHOCARDIOGRAM COMPLETE
Area-P 1/2: 3.28 cm2
Calc EF: 49.6 %
Height: 68 in
S' Lateral: 2.9 cm
Single Plane A2C EF: 42.3 %
Single Plane A4C EF: 53.2 %
Weight: 3336.88 [oz_av]

## 2023-12-30 LAB — I-STAT ARTERIAL BLOOD GAS, ED
Acid-base deficit: 10 mmol/L — ABNORMAL HIGH (ref 0.0–2.0)
Bicarbonate: 19 mmol/L — ABNORMAL LOW (ref 20.0–28.0)
Calcium, Ion: 1.15 mmol/L (ref 1.15–1.40)
HCT: 42 % (ref 39.0–52.0)
Hemoglobin: 14.3 g/dL (ref 13.0–17.0)
O2 Saturation: 100 %
Patient temperature: 96.3
Potassium: 3.4 mmol/L — ABNORMAL LOW (ref 3.5–5.1)
Sodium: 141 mmol/L (ref 135–145)
TCO2: 21 mmol/L — ABNORMAL LOW (ref 22–32)
pCO2 arterial: 49.3 mmHg — ABNORMAL HIGH (ref 32–48)
pH, Arterial: 7.187 — CL (ref 7.35–7.45)
pO2, Arterial: 218 mmHg — ABNORMAL HIGH (ref 83–108)

## 2023-12-30 LAB — HEMOGLOBIN A1C
Hgb A1c MFr Bld: 5.3 % (ref 4.8–5.6)
Mean Plasma Glucose: 105.41 mg/dL

## 2023-12-30 LAB — POCT I-STAT 7, (LYTES, BLD GAS, ICA,H+H)
Acid-base deficit: 6 mmol/L — ABNORMAL HIGH (ref 0.0–2.0)
Bicarbonate: 20.8 mmol/L (ref 20.0–28.0)
Calcium, Ion: 1.19 mmol/L (ref 1.15–1.40)
HCT: 47 % (ref 39.0–52.0)
Hemoglobin: 16 g/dL (ref 13.0–17.0)
O2 Saturation: 100 %
Patient temperature: 36.9
Potassium: 4.1 mmol/L (ref 3.5–5.1)
Sodium: 142 mmol/L (ref 135–145)
TCO2: 22 mmol/L (ref 22–32)
pCO2 arterial: 45.7 mmHg (ref 32–48)
pH, Arterial: 7.266 — ABNORMAL LOW (ref 7.35–7.45)
pO2, Arterial: 223 mmHg — ABNORMAL HIGH (ref 83–108)

## 2023-12-30 LAB — TROPONIN I (HIGH SENSITIVITY)
Troponin I (High Sensitivity): 1853 ng/L (ref <18)
Troponin I (High Sensitivity): 10214 ng/L (ref <18)
Troponin I (High Sensitivity): >24000 ng/L (ref <18)

## 2023-12-30 LAB — BASIC METABOLIC PANEL WITH GFR
Anion gap: 10 (ref 5–15)
Anion gap: 12 (ref 5–15)
BUN: 13 mg/dL (ref 6–20)
BUN: 14 mg/dL (ref 6–20)
CO2: 21 mmol/L — ABNORMAL LOW (ref 22–32)
CO2: 22 mmol/L (ref 22–32)
Calcium: 7.7 mg/dL — ABNORMAL LOW (ref 8.9–10.3)
Calcium: 8.1 mg/dL — ABNORMAL LOW (ref 8.9–10.3)
Chloride: 109 mmol/L (ref 98–111)
Chloride: 106 mmol/L (ref 98–111)
Creatinine, Ser: 1.19 mg/dL (ref 0.61–1.24)
Creatinine, Ser: 1.17 mg/dL (ref 0.61–1.24)
GFR, Estimated: >60 mL/min (ref >60)
GFR, Estimated: >60 mL/min (ref >60)
Glucose, Bld: 191 mg/dL — ABNORMAL HIGH (ref 70–99)
Glucose, Bld: 180 mg/dL — ABNORMAL HIGH (ref 70–99)
Potassium: 3.9 mmol/L (ref 3.5–5.1)
Potassium: 4 mmol/L (ref 3.5–5.1)
Sodium: 140 mmol/L (ref 135–145)
Sodium: 140 mmol/L (ref 135–145)

## 2023-12-30 LAB — I-STAT VENOUS BLOOD GAS, ED
Acid-base deficit: 10 mmol/L — ABNORMAL HIGH (ref 0.0–2.0)
Bicarbonate: 18.5 mmol/L — ABNORMAL LOW (ref 20.0–28.0)
Calcium, Ion: 1.13 mmol/L — ABNORMAL LOW (ref 1.15–1.40)
HCT: 43 % (ref 39.0–52.0)
Hemoglobin: 14.6 g/dL (ref 13.0–17.0)
O2 Saturation: 77 %
Potassium: 3.5 mmol/L (ref 3.5–5.1)
Sodium: 142 mmol/L (ref 135–145)
TCO2: 20 mmol/L — ABNORMAL LOW (ref 22–32)
pCO2, Ven: 50 mmHg (ref 44–60)
pH, Ven: 7.177 — CL (ref 7.25–7.43)
pO2, Ven: 52 mmHg — ABNORMAL HIGH (ref 32–45)

## 2023-12-30 LAB — CBC
HCT: 49.6 % (ref 39.0–52.0)
Hemoglobin: 16.1 g/dL (ref 13.0–17.0)
MCH: 30.1 pg (ref 26.0–34.0)
MCHC: 32.5 g/dL (ref 30.0–36.0)
MCV: 92.9 fL (ref 80.0–100.0)
Platelets: 283 K/uL (ref 150–400)
RBC: 5.34 MIL/uL (ref 4.22–5.81)
RDW: 12.9 % (ref 11.5–15.5)
WBC: 20.5 K/uL — ABNORMAL HIGH (ref 4.0–10.5)
nRBC: 0 % (ref 0.0–0.2)

## 2023-12-30 LAB — HEPARIN LEVEL (UNFRACTIONATED)
Heparin Unfractionated: 0.34 [IU]/mL (ref 0.30–0.70)
Heparin Unfractionated: 0.29 [IU]/mL — ABNORMAL LOW (ref 0.30–0.70)

## 2023-12-30 LAB — GLUCOSE, CAPILLARY
Glucose-Capillary: 174 mg/dL — ABNORMAL HIGH (ref 70–99)
Glucose-Capillary: 125 mg/dL — ABNORMAL HIGH (ref 70–99)
Glucose-Capillary: 126 mg/dL — ABNORMAL HIGH (ref 70–99)
Glucose-Capillary: 107 mg/dL — ABNORMAL HIGH (ref 70–99)

## 2023-12-30 LAB — APTT: aPTT: 30 s (ref 24–36)

## 2023-12-30 LAB — PROTIME-INR
INR: 1.1 (ref 0.8–1.2)
Prothrombin Time: 14.6 s (ref 11.4–15.2)

## 2023-12-30 LAB — LACTIC ACID, PLASMA
Lactic Acid, Venous: 2.1 mmol/L (ref 0.5–1.9)
Lactic Acid, Venous: 1.2 mmol/L (ref 0.5–1.9)

## 2023-12-30 LAB — TSH: TSH: 0.447 u[IU]/mL (ref 0.350–4.500)

## 2023-12-30 LAB — COOXEMETRY PANEL
Carboxyhemoglobin: 2 % — ABNORMAL HIGH (ref 0.5–1.5)
Methemoglobin: <0.7 % (ref 0.0–1.5)
O2 Saturation: 75.1 %
Total hemoglobin: 16.4 g/dL — ABNORMAL HIGH (ref 12.0–16.0)

## 2023-12-30 LAB — PHOSPHORUS: Phosphorus: 4.2 mg/dL (ref 2.5–4.6)

## 2023-12-30 LAB — MAGNESIUM
Magnesium: 1.8 mg/dL (ref 1.7–2.4)
Magnesium: 1.9 mg/dL (ref 1.7–2.4)
Magnesium: 2.2 mg/dL (ref 1.7–2.4)

## 2023-12-30 LAB — MRSA NEXT GEN BY PCR, NASAL: MRSA by PCR Next Gen: NOT DETECTED

## 2023-12-30 LAB — BRAIN NATRIURETIC PEPTIDE: B Natriuretic Peptide: 368.9 pg/mL — ABNORMAL HIGH (ref 0.0–100.0)

## 2023-12-30 LAB — I-STAT CG4 LACTIC ACID, ED: Lactic Acid, Venous: 7.9 mmol/L (ref 0.5–1.9)

## 2023-12-30 LAB — HIV ANTIBODY (ROUTINE TESTING W REFLEX): HIV Screen 4th Generation wRfx: NONREACTIVE

## 2023-12-30 MED ORDER — NOREPINEPHRINE 4 MG/250ML-% IV SOLN
0.0000 ug/min | INTRAVENOUS | Status: DC
  Administered 2023-12-30: 3 ug/min via INTRAVENOUS
  Administered 2023-12-30: 9 ug/min via INTRAVENOUS
  Filled 2023-12-30 (×2): qty 250

## 2023-12-30 MED ORDER — ATORVASTATIN CALCIUM 40 MG PO TABS: 40.0000 mg | ORAL_TABLET | Freq: Every day | ORAL | Status: DC

## 2023-12-30 MED ORDER — SUCCINYLCHOLINE CHLORIDE 20 MG/ML IJ SOLN
INTRAMUSCULAR | Status: AC | PRN
  Administered 2023-12-30: 100 mg via INTRAVENOUS

## 2023-12-30 MED ORDER — LACTATED RINGERS IV BOLUS
1000.0000 mL | Freq: Once | INTRAVENOUS | Status: AC
  Administered 2023-12-30: 1000 mL via INTRAVENOUS

## 2023-12-30 MED ORDER — ASPIRIN 81 MG PO CHEW: 324.0000 mg | CHEWABLE_TABLET | Freq: Once | ORAL | Status: DC

## 2023-12-30 MED ORDER — SODIUM CHLORIDE 0.9 % IV SOLN: INTRAVENOUS | Status: DC | PRN

## 2023-12-30 MED ORDER — PROPOFOL 1000 MG/100ML IV EMUL
0.0000 ug/kg/min | INTRAVENOUS | Status: DC
  Administered 2023-12-30: 30 ug/kg/min via INTRAVENOUS
  Administered 2023-12-30 (×2): 20 ug/kg/min via INTRAVENOUS
  Administered 2023-12-31: 35 ug/kg/min via INTRAVENOUS
  Administered 2023-12-31: 25 ug/kg/min via INTRAVENOUS
  Filled 2023-12-30 (×5): qty 100

## 2023-12-30 MED ORDER — HEPARIN BOLUS VIA INFUSION
3000.0000 [IU] | Freq: Once | INTRAVENOUS | Status: AC
  Administered 2023-12-30: 3000 [IU] via INTRAVENOUS
  Filled 2023-12-30: qty 3000

## 2023-12-30 MED ORDER — SODIUM CHLORIDE 0.9 % IV SOLN: INTRAVENOUS | Status: DC

## 2023-12-30 MED ORDER — POLYETHYLENE GLYCOL 3350 17 G PO PACK: 17.0000 g | PACK | Freq: Every day | ORAL | Status: DC

## 2023-12-30 MED ORDER — NOREPINEPHRINE 4 MG/250ML-% IV SOLN
0.0000 ug/min | INTRAVENOUS | Status: DC
  Administered 2023-12-30: 7 ug/min via INTRAVENOUS
  Administered 2023-12-31: 0 ug/min via INTRAVENOUS
  Filled 2023-12-30 (×2): qty 250

## 2023-12-30 MED ORDER — INSULIN ASPART 100 UNIT/ML IJ SOLN
0.0000 [IU] | Freq: Three times a day (TID) | INTRAMUSCULAR | Status: DC
  Administered 2023-12-30 (×3): 2 [IU] via SUBCUTANEOUS

## 2023-12-30 MED ORDER — ASPIRIN 81 MG PO CHEW
81.0000 mg | CHEWABLE_TABLET | Freq: Every day | ORAL | Status: DC
  Administered 2023-12-31 (×2): 81 mg
  Filled 2023-12-30: qty 1

## 2023-12-30 MED ORDER — CHLORHEXIDINE GLUCONATE CLOTH 2 % EX PADS
6.0000 | MEDICATED_PAD | Freq: Every day | CUTANEOUS | Status: DC
  Administered 2023-12-30 – 2024-01-01 (×3): 6 via TOPICAL

## 2023-12-30 MED ORDER — PERFLUTREN LIPID MICROSPHERE
1.0000 mL | INTRAVENOUS | Status: AC | PRN
  Administered 2023-12-30: 2 mL via INTRAVENOUS

## 2023-12-30 MED ORDER — MIDAZOLAM HCL (PF) 2 MG/2ML IJ SOLN
2.0000 mg | INTRAMUSCULAR | Status: DC | PRN
  Administered 2023-12-30 – 2023-12-31 (×5): 2 mg via INTRAVENOUS
  Filled 2023-12-30 (×5): qty 2

## 2023-12-30 MED ORDER — AMIODARONE HCL IN DEXTROSE 360-4.14 MG/200ML-% IV SOLN
60.0000 mg/h | INTRAVENOUS | Status: AC
  Administered 2023-12-30 (×2): 60 mg/h via INTRAVENOUS
  Filled 2023-12-30 (×2): qty 200

## 2023-12-30 MED ORDER — SODIUM CHLORIDE 0.9 % IV SOLN
INTRAVENOUS | Status: DC
Start: 2023-12-30 — End: 2023-12-31

## 2023-12-30 MED ORDER — SODIUM CHLORIDE 0.9% FLUSH
3.0000 mL | Freq: Two times a day (BID) | INTRAVENOUS | Status: DC
  Administered 2023-12-30 – 2023-12-31 (×2): 3 mL via INTRAVENOUS

## 2023-12-30 MED ORDER — FENTANYL 2500MCG IN NS 250ML (10MCG/ML) PREMIX INFUSION
0.0000 ug/h | INTRAVENOUS | Status: DC
  Administered 2023-12-30: 50 ug/h via INTRAVENOUS
  Administered 2023-12-31: 75 ug/h via INTRAVENOUS
  Filled 2023-12-30 (×2): qty 250

## 2023-12-30 MED ORDER — MIDAZOLAM HCL (PF) 2 MG/2ML IJ SOLN
2.0000 mg | Freq: Once | INTRAMUSCULAR | Status: AC
  Administered 2023-12-30: 2 mg via INTRAVENOUS
  Filled 2023-12-30: qty 2

## 2023-12-30 MED ORDER — ETOMIDATE 2 MG/ML IV SOLN
INTRAVENOUS | Status: AC | PRN
  Administered 2023-12-30: 20 mg via INTRAVENOUS

## 2023-12-30 MED ORDER — ASPIRIN 325 MG PO TABS: 325.0000 mg | ORAL_TABLET | Freq: Once | ORAL | Status: DC

## 2023-12-30 MED ORDER — FAMOTIDINE 20 MG PO TABS
20.0000 mg | ORAL_TABLET | Freq: Two times a day (BID) | ORAL | Status: DC
  Administered 2023-12-30 – 2023-12-31 (×3): 20 mg
  Filled 2023-12-30 (×3): qty 1

## 2023-12-30 MED ORDER — ROSUVASTATIN CALCIUM 20 MG PO TABS
40.0000 mg | ORAL_TABLET | Freq: Every day | ORAL | Status: DC
  Filled 2023-12-30: qty 2

## 2023-12-30 MED ORDER — ASPIRIN 81 MG PO CHEW
324.0000 mg | CHEWABLE_TABLET | Freq: Once | ORAL | Status: AC
  Administered 2023-12-30: 324 mg
  Filled 2023-12-30: qty 4

## 2023-12-30 MED ORDER — POLYETHYLENE GLYCOL 3350 17 G PO PACK: 17.0000 g | PACK | Freq: Every day | ORAL | Status: DC | PRN

## 2023-12-30 MED ORDER — SODIUM BICARBONATE 8.4 % IV SOLN
50.0000 meq | Freq: Once | INTRAVENOUS | Status: AC
  Administered 2023-12-30: 50 meq via INTRAVENOUS

## 2023-12-30 MED ORDER — SODIUM CHLORIDE 0.9% FLUSH: 3.0000 mL | INTRAVENOUS | Status: DC | PRN

## 2023-12-30 MED ORDER — DOCUSATE SODIUM 50 MG/5ML PO LIQD: 100.0000 mg | Freq: Two times a day (BID) | ORAL | Status: DC | PRN

## 2023-12-30 MED ORDER — ORAL CARE MOUTH RINSE
15.0000 mL | OROMUCOSAL | Status: DC
  Administered 2023-12-30 – 2023-12-31 (×20): 15 mL via OROMUCOSAL

## 2023-12-30 MED ORDER — SENNOSIDES-DOCUSATE SODIUM 8.6-50 MG PO TABS
2.0000 | ORAL_TABLET | Freq: Every day | ORAL | Status: DC
Start: 2023-12-30 — End: 2023-12-30

## 2023-12-30 MED ORDER — SODIUM CHLORIDE 0.9 % IV SOLN: 250.0000 mL | INTRAVENOUS | Status: DC | PRN

## 2023-12-30 MED ORDER — AMIODARONE HCL IN DEXTROSE 360-4.14 MG/200ML-% IV SOLN
30.0000 mg/h | INTRAVENOUS | Status: DC
  Administered 2023-12-30 – 2024-01-01 (×4): 30 mg/h via INTRAVENOUS
  Filled 2023-12-30 (×4): qty 200

## 2023-12-30 MED ORDER — MAGNESIUM SULFATE 2 GM/50ML IV SOLN
2.0000 g | Freq: Once | INTRAVENOUS | Status: AC
  Administered 2023-12-30: 2 g via INTRAVENOUS
  Filled 2023-12-30: qty 50

## 2023-12-30 MED ORDER — PROPOFOL 1000 MG/100ML IV EMUL
INTRAVENOUS | Status: AC
  Administered 2023-12-30: 25 ug/kg/min via INTRAVENOUS
  Filled 2023-12-30: qty 100

## 2023-12-30 MED ORDER — SODIUM CHLORIDE 0.9 % IV SOLN
250.0000 mL | INTRAVENOUS | Status: DC
  Administered 2023-12-30: 250 mL via INTRAVENOUS

## 2023-12-30 MED ORDER — DOCUSATE SODIUM 100 MG PO CAPS: 100.0000 mg | ORAL_CAPSULE | Freq: Two times a day (BID) | ORAL | Status: DC | PRN

## 2023-12-30 MED ORDER — ORAL CARE MOUTH RINSE: 15.0000 mL | OROMUCOSAL | Status: DC | PRN

## 2023-12-30 MED ORDER — HEPARIN (PORCINE) 25000 UT/250ML-% IV SOLN
1250.0000 [IU]/h | INTRAVENOUS | Status: DC
  Administered 2023-12-30: 1200 [IU]/h via INTRAVENOUS
  Administered 2023-12-31: 1250 [IU]/h via INTRAVENOUS
  Filled 2023-12-30 (×3): qty 250

## 2023-12-30 NOTE — Progress Notes (Signed)
 eLink Physician-Brief Progress Note Patient Name: James Castro DOB: 11/28/1965 MRN: [id number]   Date of Service  12/30/2023  HPI/Events of Note  Diarrhea.  Patient had some loose watery stools.  At that time, RN was considering placing a Flexi-Seal.  Bowel movements not continuous and currently patient not having a bowel movement.  eICU Interventions  With patient on anticoagulation, I will hold off on placing an order for Flexi-Seal for now in case it causes him to bleed.  If he does have further diarrhea which is not contained, consideration can be given to a Flexi-Seal then. Discussed with bedside RN who agrees.     Intervention Category Minor Interventions: Other:  Jerilynn Berg 12/30/2023, 6:18 AM

## 2023-12-30 NOTE — Progress Notes (Signed)
 Afternoon rounds  Echo results reviewed EF 40-45%, regional wall motion abnormality anterior wall and apex RV systolic fxn decreased.d  Grade III diastolic dysfxn  EEG negative for seizure  Had isolated episode where he woke up more. Reaching for Tube. Required bolus sedation Has had couple episodes of VT today  Trop I still trending up. 10,214-->>24000 Lactate did clear  Had one out of 4 blood cultures + c/w contamination   Plan Cont supportive care Cont to wean pressors RASS goal -1 Will touch base w/ cards. Climbing CEs. New LV dysfxn and waveform abnormality  Given he has demonstrated he is purposeful would like to get an idea when we proceed w/ ischemia eval

## 2023-12-30 NOTE — Progress Notes (Signed)
 eLink Physician-Brief Progress Note Patient Name: James Castro DOB: [birth year] MRN: [id number]   Date of Service  12/30/2023  HPI/Events of Note  please renew bil soft wrist restraints from bed side RN.   Camera: On Vent. Restraints.   eICU Interventions  Renewed to prevent harm and self injury.       Intervention Category Intermediate Interventions: Other:  Jodelle ONEIDA Hutching 12/30/2023, 9:04 PM

## 2023-12-30 NOTE — ED Provider Notes (Signed)
 [place] EMERGENCY DEPARTMENT AT [facility] Provider Note   CSN: [id number] Arrival date & time: 12/30/23  9967     Patient presents with: No chief complaint on file.   James Castro is a 58 y.o. male.   Patient brought to the emergency department by [facility] after cardiopulmonary arrest.  Patient was asleep when his significant other heard him gurgling and that he became unresponsive.  911 was called and first responders found him pulseless, initiated CPR.  It is unclear how long he was pulseless before CPR was initiated.  EMS arrived on the scene and initiated ACLS.  CPR was continued, patient was initially in V-fib.  He was ministered epi, amiodarone and shocked.  Circulation returned by patient coded a total of 3 times, each time receiving a bolus of amiodarone, epi and shocking.  Patient had an estimated total of 17 minutes of CPR.  Airway assistance provided by King's airway.  EMS report that initially the patient was following simple commands but then became agitated and was sedated with Versed and fentanyl prior to arrival in the ED.       Prior to Admission medications   Medication Sig Start Date End Date Taking? Authorizing Provider  atorvastatin (LIPITOR) 40 MG tablet Take 40 mg by mouth at bedtime.  11/27/16   [provider]  diazepam (VALIUM) 5 MG tablet Take 5 mg by mouth 2 (two) times daily. 11/08/16   [provider]  losartan (COZAAR) 50 MG tablet Take 50 mg by mouth daily. 11/27/16   [provider]  oxyCODONE-acetaminophen (PERCOCET) 10-325 MG tablet Take 1 tablet by mouth every 4 (four) hours. 11/30/16   [provider]  Verapamil HCl CR 300 MG CP24 Take 300 mg by mouth at bedtime.  11/28/16   [provider]    Allergies: Patient has no known allergies.    Review of Systems  Updated Vital Signs BP (!) 78/44   Pulse 68   Temp 98.8 F (37.1 C)   Resp (!) 22   Ht 5' 8 (1.727 m)   Wt 94.6 kg    SpO2 99%   BMI 31.71 kg/m   Physical Exam Constitutional:      General: He is in acute distress.     Appearance: He is ill-appearing.     Interventions: He is intubated.  Cardiovascular:     Rate and Rhythm: Normal rate and regular rhythm.  Pulmonary:     Effort: He is intubated.     Breath sounds: Normal air entry.  Abdominal:     General: There is distension.  Musculoskeletal:     Right lower leg: Edema present.     Left lower leg: Edema present.     Comments: Intraosseous line in left proximal lower leg  Neurological:     Mental Status: He is unresponsive.     (all labs ordered are listed, but only abnormal results are displayed) Labs Reviewed  CBC WITH DIFFERENTIAL/PLATELET - Abnormal; Notable for the following components:      Result Value   WBC 15.7 (*)    Neutro Abs 9.3 (*)    Lymphs Abs 5.5 (*)    Abs Immature Granulocytes 0.20 (*)    All other components within normal limits  COMPREHENSIVE METABOLIC PANEL WITH GFR - Abnormal; Notable for the following components:   CO2 16 (*)    Glucose, Bld 259 (*)    Creatinine, Ser 1.36 (*)    Calcium 7.8 (*)  Total Protein 5.3 (*)    Albumin 2.9 (*)    AST 129 (*)    ALT 132 (*)    Anion gap 17 (*)    All other components within normal limits  LIPID PANEL - Abnormal; Notable for the following components:   Triglycerides 170 (*)    HDL 28 (*)    LDL Cholesterol 111 (*)    All other components within normal limits  BASIC METABOLIC PANEL WITH GFR - Abnormal; Notable for the following components:   CO2 21 (*)    Glucose, Bld 191 (*)    Calcium 7.7 (*)    All other components within normal limits  CBC - Abnormal; Notable for the following components:   WBC 20.5 (*)    All other components within normal limits  BASIC METABOLIC PANEL WITH GFR - Abnormal; Notable for the following components:   Glucose, Bld 180 (*)    Calcium 8.1 (*)    All other components within normal limits  LACTIC ACID, PLASMA - Abnormal;  Notable for the following components:   Lactic Acid, Venous 2.1 (*)    All other components within normal limits  GLUCOSE, CAPILLARY - Abnormal; Notable for the following components:   Glucose-Capillary 174 (*)    All other components within normal limits  COOXEMETRY PANEL - Abnormal; Notable for the following components:   Total hemoglobin 16.4 (*)    Carboxyhemoglobin 2.0 (*)    All other components within normal limits  I-STAT CG4 LACTIC ACID, ED - Abnormal; Notable for the following components:   Lactic Acid, Venous 7.9 (*)    All other components within normal limits  I-STAT CHEM 8, ED - Abnormal; Notable for the following components:   Glucose, Bld 245 (*)    Calcium, Ion 1.13 (*)    TCO2 20 (*)    All other components within normal limits  I-STAT ARTERIAL BLOOD GAS, ED - Abnormal; Notable for the following components:   pH, Arterial 7.187 (*)    pCO2 arterial 49.3 (*)    pO2, Arterial 218 (*)    Bicarbonate 19.0 (*)    TCO2 21 (*)    Acid-base deficit 10.0 (*)    Potassium 3.4 (*)    All other components within normal limits  I-STAT VENOUS BLOOD GAS, ED - Abnormal; Notable for the following components:   pH, Ven 7.177 (*)    pO2, Ven 52 (*)    Bicarbonate 18.5 (*)    TCO2 20 (*)    Acid-base deficit 10.0 (*)    Calcium, Ion 1.13 (*)    All other components within normal limits  POCT I-STAT 7, (LYTES, BLD GAS, ICA,H+H) - Abnormal; Notable for the following components:   pH, Arterial 7.266 (*)    pO2, Arterial 223 (*)    Acid-base deficit 6.0 (*)    All other components within normal limits  TROPONIN I (HIGH SENSITIVITY) - Abnormal; Notable for the following components:   Troponin I (High Sensitivity) 1,853 (*)    All other components within normal limits  TROPONIN I (HIGH SENSITIVITY) - Abnormal; Notable for the following components:   Troponin I (High Sensitivity) 10,214 (*)    All other components within normal limits  MRSA NEXT GEN BY PCR, NASAL  CULTURE, BLOOD  (ROUTINE X 2)  CULTURE, BLOOD (ROUTINE X 2)  PROTIME-INR  APTT  HIV ANTIBODY (ROUTINE TESTING W REFLEX)  MAGNESIUM  PHOSPHORUS  HEMOGLOBIN A1C  MAGNESIUM  BLOOD GAS, ARTERIAL  TSH  BRAIN NATRIURETIC PEPTIDE  HEPARIN LEVEL (UNFRACTIONATED)  TROPONIN I (HIGH SENSITIVITY)    EKG: EKG Interpretation Date/Time:  Monday December 30 2023 01:36:59 EDT Ventricular Rate:  68 PR Interval:  180 QRS Duration:  102 QT Interval:  502 QTC Calculation: 534 R Axis:   -12  Text Interpretation: Sinus rhythm Borderline low voltage, extremity leads Nonspecific T abnormalities, lateral leads Prolonged QT interval Confirmed by Haze Lonni PARAS [phone]) on 12/30/2023 1:39:50 AM  Radiology: [facility] CHEST PORT 1 VIEW Result Date: 12/30/2023 EXAM: 1 VIEW XRAY OF THE CHEST 12/30/2023 03:45:25 AM COMPARISON: Earlier today. CLINICAL HISTORY: Encounter for central line placement. FINDINGS: LINES, TUBES AND DEVICES: Left IJ venous catheter terminating in the lower SVC. Enteric tube courses in the stomach. Endotracheal tube terminates 6 cm above the carina. LUNGS AND PLEURA: No focal pulmonary opacity. No pulmonary edema. No pleural effusion. No pneumothorax. HEART AND MEDIASTINUM: No acute abnormality of the cardiac and mediastinal silhouettes. BONES AND SOFT TISSUES: Cervical spine visualization hardware. No acute osseous abnormality. IMPRESSION: 1. Left internal jugular venous catheter tip in the lower SVC. No pneumothorax. 2. Additional stable support apparatus as above. Electronically signed by: Pinkie Pebbles MD 12/30/2023 03:51 AM EDT RP Workstation: [id number]   CT HEAD WO CONTRAST ( ) Result Date: 12/30/2023 EXAM: CT HEAD WITHOUT CONTRAST 12/30/2023 03:04:28 AM TECHNIQUE: CT of the head was performed without the administration of intravenous contrast. Automated exposure control, iterative reconstruction, and/or weight based adjustment of the mA/kV was utilized to reduce the radiation dose to as low as  reasonably achievable. COMPARISON: None available. CLINICAL HISTORY: Mental status change, persistent or worsening. Post CPR FINDINGS: BRAIN AND VENTRICLES: No acute hemorrhage. No evidence of acute infarct. No hydrocephalus. No extra-axial collection. No mass effect or midline shift. ORBITS: No acute abnormality. SINUSES: No acute abnormality. SOFT TISSUES AND SKULL: No acute soft tissue abnormality. No skull fracture. IMPRESSION: 1. No acute intracranial abnormality. Electronically signed by: Pinkie Pebbles MD 12/30/2023 03:07 AM EDT RP Workstation: [id number]   DG Abdomen 1 View Result Date: 12/30/2023 EXAM: 1 VIEW XRAY OF THE ABDOMEN 12/30/2023 01:50:55 AM COMPARISON: None available. CLINICAL HISTORY: OG placement. OG placement OG placement. OG placement. FINDINGS: LINES, TUBES AND DEVICES: Enteric tube terminates in the distal gastric body, satisfactory position. BOWEL: Nonobstructive bowel gas pattern. SOFT TISSUES: No opaque urinary calculi. BONES: No acute osseous abnormality. IMPRESSION: 1. Enteric tube terminates in the distal gastric body, in satisfactory position. Electronically signed by: Pinkie Pebbles MD 12/30/2023 01:53 AM EDT RP Workstation: [id number]   DG Chest Portable 1 View Result Date: 12/30/2023 EXAM: 1 VIEW XRAY OF THE CHEST 12/30/2023 01:03:41 AM COMPARISON: Chest x-ray 12/30/1998. CLINICAL HISTORY: intubation FINDINGS: LINES, TUBES AND DEVICES: Endotracheal tube tip measures 5 cm above the carina. Enteric tube extends below the diaphragm. LUNGS AND PLEURA: There is some mild patchy opacities in the left lung base. No pulmonary edema. No pleural effusion. No pneumothorax. HEART AND MEDIASTINUM: No acute abnormality of the cardiac and mediastinal silhouettes. BONES AND SOFT TISSUES: Cervical spine endplate abnormality is present. No acute osseous abnormality. IMPRESSION: 1. Mild patchy opacities in the left lung base may represent atelectasis or infection . 2. Endotracheal tube  tip measures 5 cm above the carina. Electronically signed by: Greig Pique MD 12/30/2023 01:08 AM EDT RP Workstation: [id number]     .Critical Care  Performed by: Haze Lonni PARAS, MD Authorized by: Haze Lonni PARAS, MD   Critical care provider statement:    Critical care time (minutes):  30  Critical care time was exclusive of:  Separately billable procedures and treating other patients and teaching time   Critical care was necessary to treat or prevent imminent or life-threatening deterioration of the following conditions:  CNS failure or compromise, respiratory failure and circulatory failure   Critical care was time spent personally by me on the following activities:  Development of treatment plan with patient or surrogate, discussions with consultants, evaluation of patient's response to treatment, examination of patient, ordering and review of laboratory studies, ordering and review of radiographic studies, ordering and performing treatments and interventions, pulse oximetry, re-evaluation of patient's condition and review of old charts   I assumed direction of critical care for this patient from another provider in my specialty: no     Care discussed with: admitting provider      Medications Ordered in the ED  0.9 %  sodium chloride infusion ( Intravenous Paused 12/30/23 0611)  amiodarone (NEXTERONE PREMIX) 360-4.14 MG/200ML-% (1.8 mg/mL) IV infusion (60 mg/hr Intravenous New Bag/Given 12/30/23 0656)  amiodarone (NEXTERONE PREMIX) 360-4.14 MG/200ML-% (1.8 mg/mL) IV infusion (30 mg/hr Intravenous Infusion Verify 12/30/23 0700)  propofol (DIPRIVAN) 1000 MG/100ML infusion (20 mcg/kg/min  99 kg Intravenous New Bag/Given 12/30/23 0726)  fentaNYL in NS (10mcg/ml) infusion-PREMIX (75 mcg/hr Intravenous Infusion Verify 12/30/23 0700)  0.9 %  sodium chloride infusion ( Intravenous Infusion Verify 12/30/23 0700)  norepinephrine (LEVOPHED) 4mg  in (0.016 mg/mL)  premix infusion (9 mcg/min Intravenous Infusion Verify 12/30/23 0700)  Chlorhexidine Gluconate Cloth 2 % PADS 6 each (has no administration in time range)  polyethylene glycol (MIRALAX / GLYCOLAX) packet 17 g (has no administration in time range)  famotidine (PEPCID) tablet 20 mg (has no administration in time range)  insulin aspart (novoLOG) injection 0-15 Units (has no administration in time range)  docusate (COLACE) 50 MG/5ML liquid 100 mg (has no administration in time range)  Oral care mouth rinse (15 mLs Mouth Rinse Given 12/30/23 0658)  Oral care mouth rinse (has no administration in time range)  atorvastatin (LIPITOR) tablet 40 mg (has no administration in time range)  heparin ADULT infusion 100 units/mL (25000 units/250mL) (1,200 Units/hr Intravenous Infusion Verify 12/30/23 0700)  0.9 %  sodium chloride infusion (has no administration in time range)  lactated ringers bolus 1,000 mL (0 mLs Intravenous Stopped 12/30/23 0244)  sodium bicarbonate injection 50 mEq (50 mEq Intravenous Given 12/30/23 0057)  etomidate (AMIDATE) injection (20 mg Intravenous Given 12/30/23 0038)  succinylcholine (ANECTINE) injection (100 mg Intravenous Given 12/30/23 0039)  magnesium sulfate IVPB 2 g 50 mL ( Intravenous Infusion Verify 12/30/23 0700)  aspirin chewable tablet 324 mg (324 mg Per Tube Given 12/30/23 0557)  heparin bolus via infusion 3,000 Units (3,000 Units Intravenous Bolus from Bag 12/30/23 0621)                                    Medical Decision Making Amount and/or Complexity of Data Reviewed External Data Reviewed: labs and notes. Labs: ordered. Decision-making details documented in ED Course. Radiology: ordered and independent interpretation performed. Decision-making details documented in ED Course. ECG/medicine tests: ordered and independent interpretation performed. Decision-making details documented in ED Course.  Risk Prescription drug management. Decision regarding  hospitalization.   Presents to the emergency department after cardiac arrest.  Patient was noted to be gurgling by his wife.  Additional information has been obtained from her.  She actually did immediately start CPR.  Patient had appropriate ACLS during transport provided by EMS.  Patient with 17 minutes of CPR and multiple rounds of epi, shock for V-fib.  Patient had been sedated by EMS for agitation, was very sedated but initiating breaths at arrival.  King's airway was removed and patient was intubated by Ubaldo High, PA-C under my direct supervision.  Please see separate note.  Blood pressures dropped after intubation and with continued sedation.  Started on peripheral strength Levophed.  EKG without ST elevations or obvious significant ischemia, troponin is markedly elevated.  Cardiology has been consulted to determine need for any intervention.  Patient will be admitted by critical care.     Final diagnoses:  Cardiac arrest [facility])    ED Discharge Orders     None          Haze Lonni PARAS, MD 12/30/23 [phone]

## 2023-12-30 NOTE — Progress Notes (Signed)
 UOP boarderline  Still on NE but weaning some  Plan  Will add MIVFs

## 2023-12-30 NOTE — ED Notes (Signed)
 Pollina MD notified of family present

## 2023-12-30 NOTE — Procedures (Signed)
 Patient Name: James Castro  MRN: [id number]  Epilepsy Attending: Arlin MALVA Krebs  Referring Physician/Provider: Gleason, Leita SAUNDERS, PA-C  Date: 12/30/2023 Duration: 23.16 mins  Patient history: 58yo M s/p cardiac arrest. EEG to evaluate for seizure  Level of alertness: Comatose/lethargic  AEDs during EEG study: Propofol  Technical aspects: This EEG study was done with scalp electrodes positioned according to the 10-20 International system of electrode placement. Electrical activity was reviewed with band pass filter of 1-70Hz , sensitivity of 7 uV/mm, display speed of 68mm/sec with a 60Hz  notched filter applied as appropriate. EEG data were recorded continuously and digitally stored.  Video monitoring was available and reviewed as appropriate.  Description: EEG showed continuous generalized 3 to 6 Hz theta-delta slowing admixed with 12-135 Hz beta activity. Hyperventilation and photic stimulation were not performed.     ABNORMALITY - Continuous slow, generalized  IMPRESSION: This study is suggestive of generalized cerebral dysfunction (encephalopathy).  No seizures or epileptiform discharges were seen throughout the recording.  Mort Smelser O Taygen Acklin

## 2023-12-30 NOTE — Progress Notes (Signed)
 eLink Physician-Brief Progress Note Patient Name: ARMAS MCBEE DOB: 11-25-1965 MRN: [id number]   Date of Service  12/30/2023  HPI/Events of Note  58 year old male whose wife called EMS because patient started gagging at night and suddenly became unresponsive.  She started CPR and called 911.  EMS arrived to find him in V-fib and he had a total of 3 shocks with epi and amiodarone which led to ROSC.  Total CPR time was was 70 minutes.  Patient intubated in the field and while being transported to the ED became agitated and was sedated.  Prior to getting sedated, was reportedly following simple commands.  Admitted to ICU.  Troponin elevated in keeping with non-ST elevation MI.  eICU Interventions  Patient's chart reviewed.  Pertinent labs and imaging studies reviewed.  Video assessment of patient.  Case discussed with both patient's spouse and bedside RN. Impression:  non-STEMI. Cardiac arrest Lactic acidosis Ventricular fibrillation  On anticoagulation with heparin.  Also on antiplatelet therapy with aspirin.  Statin resumed. Cardiology to eval for possible coronary angiography later in the day. Currently comfortable on the vent sedated with fentanyl and propofol.  Will reassess for extubation once cardiology clears him. Lactic acidosis largely resolved.        Jerilynn Berg 12/30/2023, 5:39 AM

## 2023-12-30 NOTE — Progress Notes (Signed)
 RT transported patient from ER Trauma A to CT and from CT to 2H-20. RN was present throughout the trip. The patients vital signs are stable. Handoff given prior to arrival to the unit.

## 2023-12-30 NOTE — ED Notes (Signed)
 Cardiolgy has been paged for second time first time at 0114am and secind time 0145am, per Doctor pollina request , there has been no answer as of now. Intensive care doctor called

## 2023-12-30 NOTE — Progress Notes (Signed)
 Advanced Heart Failure Rounding Note  Cardiologist: None  Chief Complaint: OOH Arrest  Subjective:    Remains intubated/seated.   Will move purposely when sedation lightened.   Having NSVT on iv amio. SABRA Hemodynamics stable on NE 10   Echo EF 40-45% distal anterior wall and apical HK    Objective:    Weight Range: 94.6 kg Body mass index is 31.71 kg/m.   Vital Signs:   Temp:  [95.8 F (35.4 C)-99.9 F (37.7 C)] 99.9 F (37.7 C) (10/27 1245) Pulse Rate:  [51-92] 68 (10/27 0315) Resp:  [12-25] 25 (10/27 1245) BP: (61-130)/(44-105) 84/59 (10/27 1245) SpO2:  [86 %-100 %] 99 % (10/27 1245) FiO2 (%):  [40 %-100 %] 40 % (10/27 1217) Weight:  [94.6 kg-99 kg] 94.6 kg (10/27 0315) Last BM Date : 12/30/23  Weight change: Filed Weights   12/30/23 0048 12/30/23 0315  Weight: 99 kg 94.6 kg   Intake/Output:  Intake/Output Summary (Last 24 hours) at 12/30/2023 1455 Last data filed at 12/30/2023 1400 Gross per 24 hour  Intake 2383.37 ml  Output 865 ml  Net 1518.37 ml    Physical Exam    General:  Intubated/sedated HEENT: normal +TEE Neck: supple. no JVD.  Cor: Regular rate & rhythm. No rubs, gallops or murmurs. Lungs: clear Abdomen: soft, nontender, nondistended.Good bowel sounds. Extremities: no cyanosis, clubbing, rash, edema Neuro: intubated/sedated  Telemetry   Sinus 50-60s Personally reviewed  Labs    CBC Recent Labs    12/30/23 0044 12/30/23 0054 12/30/23 0337 12/30/23 0453  WBC 15.7*  --  20.5*  --   NEUTROABS 9.3*  --   --   --   HGB 14.6  15.0  14.6   < > 16.1 16.0  HCT 46.1  44.0  43.0   < > 49.6 47.0  MCV 96.0  --  92.9  --   PLT 268  --  283  --    < > = values in this interval not displayed.   Basic Metabolic Panel Recent Labs    89/72/74 0234 12/30/23 0337 12/30/23 0453  NA 140 140 142  K 3.9 4.0 4.1  CL 109 106  --   CO2 21* 22  --   GLUCOSE 191* 180*  --   BUN 13 14  --   CREATININE 1.19 1.17  --   CALCIUM 7.7* 8.1*   --   MG 1.8 1.9  --   PHOS 4.2  --   --    Liver Function Tests Recent Labs    12/30/23 0044  AST 129*  ALT 132*  ALKPHOS 92  BILITOT 0.6  PROT 5.3*  ALBUMIN 2.9*   BNP (last 3 results) Recent Labs    12/30/23 1027  BNP 368.9*   Hemoglobin A1C Recent Labs    12/30/23 0044  HGBA1C 5.3   Fasting Lipid Panel Recent Labs    12/30/23 0044  CHOL 173  HDL 28*  LDLCALC 111*  TRIG 170*  CHOLHDL 6.2   Thyroid Function Tests Recent Labs    12/30/23 1027  TSH 0.447   Medications:    Scheduled Medications:  [START ON 12/31/2023] aspirin  81 mg Per Tube Daily   atorvastatin  40 mg Per Tube QHS   Chlorhexidine Gluconate Cloth  6 each Topical Daily   famotidine  20 mg Per Tube BID   insulin aspart  0-15 Units Subcutaneous TID WC   midazolam PF  2 mg Intravenous Once  mouth rinse  15 mL Mouth Rinse Q2H    Infusions:  sodium chloride Stopped (12/30/23 0905)   sodium chloride 20 mL/hr at 12/30/23 1400   sodium chloride     amiodarone 30 mg/hr (12/30/23 1400)   fentaNYL infusion INTRAVENOUS 75 mcg/hr (12/30/23 1400)   heparin 1,250 Units/hr (12/30/23 1400)   norepinephrine (LEVOPHED) Adult infusion 10 mcg/min (12/30/23 1400)   propofol (DIPRIVAN) infusion 20 mcg/kg/min (12/30/23 1400)    PRN Medications: sodium chloride, docusate, mouth rinse, perflutren lipid microspheres (DEFINITY) IV suspension, polyethylene glycol  Patient Profile   58 y.o. 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 Admitted after cardiac arrest.  Assessment/Plan    1. OOH VF Arrest - arrested in bed overnight, wife started CPR, EMS took over on arrival [17 minute before ROSC] defib x3 - intubated; vent per CCM - hstrop > 24k - Echo EF 40-45% distal anterior wall and apical HK  - purposeful movement on exam  - suspect LAD infarct - Keep intubated overnight on  normothermia. Plan cath tomorrow. D/w wife at bedside  2. CAD s/p NSTEMI - he has very subtle anterior ST elevation on ECG - hstrop> 24K - Continue heparin/ASA/statin  - Cath tomorrow  3. Ischemic CM/acute systolic HF - continue NE - GDMT when able  4. Acute hypoxic resp failure - on vent -> management per CCM  5. NSVT - continue amio - Keep K> 4.0 Mg > 2.0   CRITICAL CARE Performed by: Cherrie Sieving  Total critical care time: 45 minutes  Critical care time was exclusive of separately billable procedures and treating other patients.  Critical care was necessary to treat or prevent imminent or life-threatening deterioration.  Critical care was time spent personally by me (independent of midlevel providers or residents) on the following activities: development of treatment plan with patient and/or surrogate as well as nursing, discussions with consultants, evaluation of patient's response to treatment, examination of patient, obtaining history from patient or surrogate, ordering and performing treatments and interventions, ordering and review of laboratory studies, ordering and review of radiographic studies, pulse oximetry and re-evaluation of patient's condition.  Length of Stay: 0  Sieving Cherrie, MD  3:58 PM  Advanced Heart Failure Team Pager [phone] (M-F; 7a - 5p)  Please contact [facility] for night-coverage after hours (5p -7a ) and weekends on [email or web address]

## 2023-12-30 NOTE — Progress Notes (Signed)
 eLink Physician-Brief Progress Note Patient Name: James Castro DOB: Nov 08, 1965 MRN: [id number]   Date of Service  12/30/2023  HPI/Events of Note  Notified of decrease in lactate from 7.9-2.1.  eICU Interventions  Markedly improved.  No need to continue to trend.     Intervention Category Minor Interventions: Other:  Jerilynn Berg 12/30/2023, 4:17 AM

## 2023-12-30 NOTE — H&P (Signed)
 NAME:  James Castro, MRN:  [id number], DOB:  31-Dec-1965, LOS: 0 ADMISSION DATE:  12/30/2023, CONSULTATION DATE:  12/30/23 REFERRING MD:  EDP, CHIEF COMPLAINT:  cardiac arrest   History of Present Illness:  James Castro is a 58 y.o. 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  She called 911 and started chest compressions, EMS found patient in Vfib and he underwent a total of 3 shocks,  3 epi pushes and amiodarone before ROSC, approximately 17 minutes.  Pt was intubated and in the ED was overbreathing the vent and had a gag reflex.  Wife stated he was more fatigued prior to this event, but otherwise seemed in his usual state of health  In the ED, pt's work-up was significant for troponin of 1853, AKI, mildly elevated transaminases and  lactic acid of 7.   Seen by cardiology, EKG does not show signs of STEMI  Pertinent  Medical History   has a past medical history of High cholesterol and Hypertension.   Significant Hospital Events: Including procedures, antibiotic start and stop dates in addition to other pertinent events   10/27 OOH cardiac arrest  Interim History / Subjective:  On norepi 9mcg  Objective    Blood pressure (!) 88/68, pulse 67, temperature (!) 96.3 F (35.7 C), resp. rate (!) 22, height 5' 10 (1.778 m), weight 99 kg, SpO2 100%.    Vent Mode: PRVC FiO2 (%):  [100 %] 100 % Set Rate:  [18 bmp-22 bmp] 22 bmp Vt Set:  [550 mL] 550 mL PEEP:  [5 cmH20] 5 cmH20   Intake/Output Summary (Last 24 hours) at 12/30/2023 0204 Last data filed at 12/30/2023 0145 Gross per 24 hour  Intake 26.64 ml  Output --  Net 26.64 ml   Filed Weights   12/30/23 0048  Weight: 99 kg    General:  well nourished M critically ill intubated and sedated  HEENT: MM pink/moist, ETT in  place, sclera anicteric Neuro: PERRLA, + gag, overbreathing vent, examined on propofol RASS -3 CV: s1s2 rrr, no m/r/g PULM:  clear bilaterally on PRVC  GI: soft, nondistended  Extremities: warm/dry, no edema     Resolved problem list   Assessment and Plan   Out of hospital Vfib arrest  NSTEMI Elevated Transaminases AKI Cardiogenic shock -on amiodarone gtt, seen by cardiology and do not plan to cath tonight.  May possible take later today, keep NPO, load with Asa, start heparin gtt, obtain echo -CTH negative, EEG -normothermia protocol -lactic clearing, trop uptrending to 10k, repeat EKG -monitor renal function and LFT's -monitor UOP, replace lytes prn -check BNP and Coox  -SSI for hyperglycemia, A1c -resume home statin, lipid panel -norepi to maintain MAP >65     HTN  -hold home medications    History of colon Ca -s/p hemicolectomy, under surveillance     Labs   CBC: Recent Labs  Lab 12/30/23 0044 12/30/23 0054  WBC 15.7*  --   NEUTROABS 9.3*  --   HGB 14.6  15.0  14.6 14.3  HCT 46.1  44.0  43.0 42.0  MCV 96.0  --   PLT 268  --     Basic Metabolic Panel: Recent Labs  Lab 12/30/23 0044 12/30/23 0054  NA 140  143  142 141  K 3.5  3.6  3.5 3.4*  CL 107  105  --   CO2 16*  --   GLUCOSE 259*  245*  --   BUN 14  15  --   CREATININE 1.36*  1.20  --   CALCIUM 7.8*  --    GFR: Estimated Creatinine Clearance: 69.8 mL/min (A) (by C-G formula based on SCr of 1.36 mg/dL (H)). Recent Labs  Lab 12/30/23 0044 12/30/23 0045  WBC 15.7*  --   LATICACIDVEN  --  7.9*    Liver Function Tests: Recent Labs  Lab 12/30/23 0044  AST 129*  ALT 132*  ALKPHOS 92  BILITOT 0.6  PROT 5.3*  ALBUMIN 2.9*   No results for input(s): LIPASE, AMYLASE in the last 168 hours. No results for input(s): AMMONIA in the last 168 hours.  ABG    Component Value Date/Time   PHART 7.187 (LL) 12/30/2023 0054   PCO2ART 49.3 (H) 12/30/2023 0054   PO2ART  218 (H) 12/30/2023 0054   HCO3 19.0 (L) 12/30/2023 0054   TCO2 21 (L) 12/30/2023 0054   ACIDBASEDEF 10.0 (H) 12/30/2023 0054   O2SAT 100 12/30/2023 0054     Coagulation Profile: Recent Labs  Lab 12/30/23 0044  INR 1.1    Cardiac Enzymes: No results for input(s): CKTOTAL, CKMB, CKMBINDEX, TROPONINI in the last 168 hours.  HbA1C: No results found for: HGBA1C  CBG: No results for input(s): GLUCAP in the last 168 hours.  Review of Systems:   Unable to obtain   Past Medical History:  He,  has a past medical history of High cholesterol and Hypertension.   Surgical History:  No past surgical history on file.   Social History:   reports that he has been smoking cigarettes. He has never used smokeless tobacco.   Family History:  His family history is not on file.   Allergies No Known Allergies   Home Medications  Prior to Admission medications   Medication Sig Start Date End Date Taking? Authorizing Provider  atorvastatin (LIPITOR) 40 MG tablet Take 40 mg by mouth at bedtime.  11/27/16   [provider]  diazepam (VALIUM) 5 MG tablet Take 5 mg by mouth 2 (two) times daily. 11/08/16   [provider]  losartan (COZAAR) 50 MG tablet Take 50 mg by mouth daily. 11/27/16   [provider]  oxyCODONE-acetaminophen (PERCOCET) 10-325 MG tablet Take 1 tablet by mouth every 4 (four) hours. 11/30/16   [provider]  Verapamil HCl CR 300 MG CP24 Take 300 mg by mouth at bedtime.  11/28/16   [provider]     Critical care time:  50 minutes       CRITICAL CARE Performed by: Leita SAUNDERS Kimala Horne   Total critical care time: 50 minutes  Critical care time was exclusive of separately billable procedures and treating other patients.  Critical care was necessary to treat or prevent imminent or life-threatening deterioration.  Critical care was time spent personally by me on the following activities: development of treatment plan  with patient and/or surrogate as well as nursing, discussions with consultants, evaluation of patient's response to treatment, examination of patient, obtaining history from patient or surrogate, ordering and performing treatments and interventions, ordering and review of laboratory studies, ordering and review of radiographic studies, pulse oximetry and re-evaluation of patient's condition.  Leita SAUNDERS Carlyle Achenbach, PA-C [facility] See Amion for pager If no response to pager , please call [phone] until 7pm After 7:00  pm call Elink  [phone]

## 2023-12-30 NOTE — Procedures (Signed)
 Central Venous Catheter Insertion Procedure Note  BOBY EYER  [id number]  1965/11/24  Date:12/30/23  Time:2:57 AM   Provider Performing:Eris Breck R Laketra Bowdish   Procedure: Insertion of Non-tunneled Central Venous Catheter(36556) with US  guidance (23062)   Indication(s) Medication administration  Consent Unable to obtain consent due to emergent nature of procedure.  Anesthesia Topical only with 1% lidocaine   Timeout Verified patient identification, verified procedure, site/side was marked, verified correct patient position, special equipment/implants available, medications/allergies/relevant history reviewed, required imaging and test results available.  Sterile Technique Maximal sterile technique including full sterile barrier drape, hand hygiene, sterile gown, sterile gloves, mask, hair covering, sterile ultrasound probe cover (if used).  Procedure Description Area of catheter insertion was cleaned with chlorhexidine and draped in sterile fashion.  With real-time ultrasound guidance a central venous catheter was placed into the left internal jugular vein. Nonpulsatile blood flow and easy flushing noted in all ports.  The catheter was sutured in place and sterile dressing applied.  Complications/Tolerance None; patient tolerated the procedure well. Chest X-ray is ordered to verify placement for internal jugular or subclavian cannulation.   Chest x-ray is not ordered for femoral cannulation.  EBL Minimal  Specimen(s) None   Leita SAUNDERS Luva Metzger, PA-C

## 2023-12-30 NOTE — Progress Notes (Signed)
 EEG complete - results pending

## 2023-12-30 NOTE — Progress Notes (Addendum)
 PHARMACY - ANTICOAGULATION CONSULT NOTE  Pharmacy Consult for heparin Indication: s/p cardiac arrest >> concern for ACS  No Known Allergies  Patient Measurements: Height: 5' 8 (172.7 cm) Weight: 94.6 kg (208 lb 8.9 oz) IBW/kg (Calculated) : 68.4 HEPARIN DW (KG): 88.2  Vital Signs: Temp: 99.9 F (37.7 C) (10/27 1245) Temp Source: Bladder (10/27 0800) BP: 84/59 (10/27 1245) Pulse Rate: 68 (10/27 0315)  Labs: Recent Labs    12/30/23 0044 12/30/23 0054 12/30/23 0234 12/30/23 0337 12/30/23 0453 12/30/23 1027 12/30/23 1219  HGB 14.6  15.0  14.6 14.3  --  16.1 16.0  --   --   HCT 46.1  44.0  43.0 42.0  --  49.6 47.0  --   --   PLT 268  --   --  283  --   --   --   APTT 30  --   --   --   --   --   --   LABPROT 14.6  --   --   --   --   --   --   INR 1.1  --   --   --   --   --   --   HEPARINUNFRC  --   --   --   --   --   --  0.34  CREATININE 1.36*  1.20  --  1.19 1.17  --   --   --   TROPONINIHS 1,853*  --  10,214*  --   --  >24,000*  --     Estimated Creatinine Clearance: 76.8 mL/min (by C-G formula based on SCr of 1.17 mg/dL).   Medical History: Past Medical History:  Diagnosis Date   High cholesterol    Hypertension     Medications:  See MAR  Assessment: 58yo male presented to ED s/p Vfib arrest, troponins found elevated and rising which could be d/t ischemia in setting of arrest but c/f ACS as precipitating cause >> to begin heparin during further w/u.  Initial heparin level 0.34 is therapeutic on 1200 units/hr, 2nd level was sent down and resulted 0.29 slightly below goal.  CBC stable (Hgb 16, pltc 283), no infusion issues.  Goal of Therapy:  Heparin level 0.3-0.7 units/ml Monitor platelets by anticoagulation protocol: Yes   Plan:  Increase heparin IV slightly to 1250 units/hr to stay within range Monitor heparin levels and CBC daily   Maurilio Fila, PharmD Clinical Pharmacist 12/30/2023  1:38 PM

## 2023-12-30 NOTE — Progress Notes (Signed)
 PHARMACY - PHYSICIAN COMMUNICATION CRITICAL VALUE ALERT - POSITIVE BLOOD CULTURE   James Castro is an 58 y.o. male who presented to [facility] on 12/30/2023 with a chief complaint of cardiac arrest  Assessment:  1/4 aerobic bottles containing GPRs, which could likely be a contaminant. Patient is not currently on antibiotics.   Name of physician (or Provider) Contacted: Dr. Gatha  Current antibiotics: none  Changes to prescribed antibiotics recommended:  Continue to monitor off of antibiotics at this time  No results found for this or any previous visit.  James Castro, PharmD PGY-1 Pharmacy Resident [facility] 12/30/2023 2:36 PM

## 2023-12-30 NOTE — Progress Notes (Signed)
 PHARMACY - ANTICOAGULATION CONSULT NOTE  Pharmacy Consult for heparin Indication: s/p cardiac arrest >> concern for ACS  No Known Allergies  Patient Measurements: Height: 5' 8 (172.7 cm) Weight: 94.6 kg (208 lb 8.9 oz) IBW/kg (Calculated) : 68.4 HEPARIN DW (KG): 88.2  Vital Signs: Temp: 98.1 F (36.7 C) (10/27 0415) BP: 114/88 (10/27 0415) Pulse Rate: 68 (10/27 0315)  Labs: Recent Labs    12/30/23 0044 12/30/23 0054 12/30/23 0234 12/30/23 0337  HGB 14.6  15.0  14.6 14.3  --  16.1  HCT 46.1  44.0  43.0 42.0  --  49.6  PLT 268  --   --  283  APTT 30  --   --   --   LABPROT 14.6  --   --   --   INR 1.1  --   --   --   CREATININE 1.36*  1.20  --  1.19 1.17  TROPONINIHS 1,853*  --  10,214*  --     Estimated Creatinine Clearance: 76.8 mL/min (by C-G formula based on SCr of 1.17 mg/dL).   Medical History: Past Medical History:  Diagnosis Date   High cholesterol    Hypertension     Medications:  Medications Prior to Admission  Medication Sig Dispense Refill Last Dose/Taking   atorvastatin (LIPITOR) 40 MG tablet Take 40 mg by mouth at bedtime.   11    diazepam (VALIUM) 5 MG tablet Take 5 mg by mouth 2 (two) times daily.  0    losartan (COZAAR) 50 MG tablet Take 50 mg by mouth daily.  10    oxyCODONE-acetaminophen (PERCOCET) 10-325 MG tablet Take 1 tablet by mouth every 4 (four) hours.  0    Verapamil HCl CR 300 MG CP24 Take 300 mg by mouth at bedtime.   10    Scheduled:   aspirin  324 mg Oral Once   atorvastatin  40 mg Oral QHS   Chlorhexidine Gluconate Cloth  6 each Topical Daily   famotidine  20 mg Per Tube BID   insulin aspart  0-15 Units Subcutaneous TID WC   mouth rinse  15 mL Mouth Rinse Q2H   Infusions:   sodium chloride     sodium chloride 20 mL/hr at 12/30/23 0300   amiodarone 60 mg/hr (12/30/23 0300)   amiodarone     fentaNYL infusion INTRAVENOUS 75 mcg/hr (12/30/23 0300)   norepinephrine (LEVOPHED) Adult infusion 9 mcg/min (12/30/23 0300)    propofol (DIPRIVAN) infusion 20 mcg/kg/min (12/30/23 0300)    Assessment: 58yo male presented to ED s/p Vfib arrest, troponins found elevated and rising which could be d/t ischemia in setting of arrest but c/f ACS as precipitating cause >> to begin heparin during further w/u.  Goal of Therapy:  Heparin level 0.3-0.7 units/ml Monitor platelets by anticoagulation protocol: Yes   Plan:  Heparin 3000 units IV bolus followed by infusion at 1200 units/hr. Monitor heparin levels and CBC.  Marvetta Dauphin, PharmD, BCPS  12/30/2023,4:26 AM

## 2023-12-30 NOTE — ED Triage Notes (Signed)
 From home, wife reported gurgling sounds, pt fell unresponsive, 17 minutes of chest compresions, Vfib shock 3 times. 4 epi, 100 fentanyl, 10 versed and 450 amiodarone.  134/84   Hx of colon cancer

## 2023-12-30 NOTE — ED Provider Notes (Signed)
  Physical Exam  BP (!) 117/94   Pulse 92   Temp (!) 96.3 F (35.7 C)   Resp 12   SpO2 100%   Physical Exam  Procedures  Procedure Name: Intubation Date/Time: 12/30/2023 12:48 AM  Performed by: Logan Ubaldo NOVAK, PA-COxygen Delivery Method: Ambu bag Preoxygenation: Pre-oxygenation with 100% oxygen Induction Type: Rapid sequence Ventilation: Mask ventilation without difficulty Tube size: 7.5 mm Number of attempts: 1 Airway Equipment and Method: Stylet and Video-laryngoscopy Placement Confirmation: Positive ETCO2, CO2 detector and Breath sounds checked- equal and bilateral Secured at: 24 cm Tube secured with: ETT holder      ED Course / MDM    Medical Decision Making         Logan Ubaldo NOVAK DEVONNA 12/30/23 0049    Haze Lonni PARAS, MD 12/30/23 [phone]

## 2023-12-30 NOTE — Consult Note (Signed)
 Cardiology Consultation   Patient ID: James Castro MRN: [id number]; DOB: [birth year]  Admit date: 12/30/2023 Date of Consult: 12/30/2023  PCP:  Luke Agent, MD (Inactive)   [facility] Providers Cardiologist:  None        Patient Profile: James Castro is a 58 y.o. 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 Haze.  History of Present Illness: Mr. James Castro was in his USOH before going to bed this evening outside of mild fatigue. His wife reports that she j heard him gurgling in the bed overnight and found him unresponsive.  She then called 911 and reported he had no pulse so they instructed her to start CPR which she did.  He remained pulseless at the time of EMS arrival and was found to be in VF.  He had a 17-minute code with VF throughout and received 3 total defibrillations and multiple doses of epi and amiodarone.  ROSC was then obtained.  He was intubated during the code and was initially following simple commands but ultimately was sedated due to agitation prior to arrival in the ED.   Of note, he has a chart history of heart valve disorder but there is no formal documentation of valvular disease; TTE from 2022 in [facility] showed normal LVEF with no valve disease outside of mildly thickened aortic valve and trivial MR/TR.    Past Medical History:  Diagnosis Date   High cholesterol    Hypertension     No past surgical history on file.   Home Medications:  Prior to Admission medications   Medication Sig Start Date End Date Taking? Authorizing Provider  atorvastatin (LIPITOR) 40 MG tablet Take 40 mg by mouth at bedtime.  11/27/16   [provider]  diazepam (VALIUM) 5 MG tablet Take 5 mg by mouth 2 (two) times daily. 11/08/16   [provider]  losartan (COZAAR) 50 MG tablet Take 50 mg by mouth daily. 11/27/16   [provider]  oxyCODONE-acetaminophen (PERCOCET) 10-325 MG tablet Take 1 tablet by mouth every 4 (four) hours. 11/30/16   [provider]  Verapamil HCl CR 300 MG CP24 Take 300 mg by mouth at bedtime.  11/28/16   [provider]    Scheduled Meds:  Chlorhexidine Gluconate Cloth  6 each Topical Daily   famotidine  20 mg Per Tube BID   insulin aspart  0-15 Units Subcutaneous TID WC   mouth rinse  15 mL Mouth Rinse Q2H   Continuous Infusions:  sodium chloride     sodium chloride 20 mL/hr at 12/30/23 0300   amiodarone 60 mg/hr (12/30/23 0300)   amiodarone     fentaNYL infusion INTRAVENOUS 75 mcg/hr (12/30/23 0300)   norepinephrine (LEVOPHED) Adult infusion 9 mcg/min (12/30/23 0300)   propofol (DIPRIVAN) infusion 20 mcg/kg/min (12/30/23 0300)   PRN Meds: docusate, mouth rinse, polyethylene glycol  Allergies:   No Known Allergies  Social History:   Social History   Socioeconomic History   Marital status: Married    Spouse name: Not on file   Number of children: Not on file   Years of education: Not on file   Highest education level: Not on file  Occupational History   Not on file  Tobacco Use   Smoking status: Every Day  Types: Cigarettes   Smokeless tobacco: Never  Substance and Sexual Activity   Alcohol use: Not on file   Drug use: Not on file   Sexual activity: Not on file  Other Topics Concern   Not on file  Social History Narrative   Not on file   Social Drivers of Health   Financial Resource Strain: Not on file  Food Insecurity: Not on file  Transportation Needs: Not on file  Physical Activity: Not on file  Stress: Not on file  Social Connections: Not on file  Intimate Partner Violence: Not on file    Family History:   No family history on file.   ROS:  Please see the history of present illness. ROS unable to obtain due to sedation/mental  status.   Physical Exam/Data: Vitals:   12/30/23 0230 12/30/23 0245 12/30/23 0315 12/30/23 0325  BP: (!) 114/95 130/88    Pulse: (!) 51 63    Resp: (!) 23 (!) 23    Temp: (!) 96.3 F (35.7 C) (!) 96.6 F (35.9 C)  (!) 97.5 F (36.4 C)  SpO2: (!) 86% 100%    Weight:      Height:   5' 8 (1.727 m)     Intake/Output Summary (Last 24 hours) at 12/30/2023 0412 Last data filed at 12/30/2023 0300 Gross per 24 hour  Intake 1179.29 ml  Output 400 ml  Net 779.29 ml      12/30/2023   12:48 AM 02/24/2009    2:14 PM  Last 3 Weights  Weight (lbs) 218 lb 4.1 oz 218 lb 4 oz  Weight (kg) 99 kg 98.998 kg     Body mass index is 33.19 kg/m.  General: Intubated/sedated HEENT: Small nonreactive pupils Neck: no JVD Vascular: Distal pulses 2+ bilaterally Cardiac:  normal S1, S2; RRR; no murmur Lungs: Mechanical breath sounds Abd: soft, no hepatomegaly  Ext: no edema Musculoskeletal:  No deformities Neuro: Intubated/sedated, does not withdrawal to nailbed pressure  EKG:  The EKG was personally reviewed and demonstrates:  NSR, no T wave inversions or ST elevations Telemetry:  Telemetry was personally reviewed and demonstrates: NSR in 60s to 80s with rare PVCs  Relevant CV Studies: TTE 03/2020 ([place], in [place]) 1. The left ventricle is normal in size with normal wall thickness.  2. The left ventricular systolic function is normal, LVEF is visually estimated at 60-65%.  3. Mitral annular calcification is present.  4. The aortic valve is trileaflet with mildly thickened leaflets with normal excursion.  5. The right ventricle is normal in size, with normal systolic function.  6. There is no pulmonary hypertension, estimated pulmonary artery systolic pressure is 23 mmHg.  7. The right atrium is mildly dilated  in size.   Laboratory Data: High Sensitivity Troponin:   Recent Labs  Lab 12/30/23 0044 12/30/23 0234  TROPONINIHS 1,853* 10,214*     Chemistry Recent Labs   Lab 12/30/23 0044 12/30/23 0054 12/30/23 0234 12/30/23 0337  NA 140  143  142 141 140 140  K 3.5  3.6  3.5 3.4* 3.9 4.0  CL 107  105  --  109 106  CO2 16*  --  21* 22  GLUCOSE 259*  245*  --  191* 180*  BUN 14  15  --  13 14  CREATININE 1.36*  1.20  --  1.19 1.17  CALCIUM 7.8*  --  7.7* 8.1*  MG  --   --  1.8 1.9  GFRNONAA >60  --  >  60 >60  ANIONGAP 17*  --  10 12    Recent Labs  Lab 12/30/23 0044  PROT 5.3*  ALBUMIN 2.9*  AST 129*  ALT 132*  ALKPHOS 92  BILITOT 0.6   Lipids  Recent Labs  Lab 12/30/23 0044  CHOL 173  TRIG 170*  HDL 28*  LDLCALC 111*  CHOLHDL 6.2    Hematology Recent Labs  Lab 12/30/23 0044 12/30/23 0054 12/30/23 0337  WBC 15.7*  --  20.5*  RBC 4.80  --  5.34  HGB 14.6  15.0  14.6 14.3 16.1  HCT 46.1  44.0  43.0 42.0 49.6  MCV 96.0  --  92.9  MCH 30.4  --  30.1  MCHC 31.7  --  32.5  RDW 12.9  --  12.9  PLT 268  --  283   Thyroid No results for input(s): TSH, FREET4 in the last 168 hours.  BNPNo results for input(s): BNP, PROBNP in the last 168 hours.  DDimer No results for input(s): DDIMER in the last 168 hours.  Radiology/Studies:  DG CHEST PORT 1 VIEW Result Date: 12/30/2023 EXAM: 1 VIEW XRAY OF THE CHEST 12/30/2023 03:45:25 AM COMPARISON: Earlier today. CLINICAL HISTORY: Encounter for central line placement. FINDINGS: LINES, TUBES AND DEVICES: Left IJ venous catheter terminating in the lower SVC. Enteric tube courses in the stomach. Endotracheal tube terminates 6 cm above the carina. LUNGS AND PLEURA: No focal pulmonary opacity. No pulmonary edema. No pleural effusion. No pneumothorax. HEART AND MEDIASTINUM: No acute abnormality of the cardiac and mediastinal silhouettes. BONES AND SOFT TISSUES: Cervical spine visualization hardware. No acute osseous abnormality. IMPRESSION: 1. Left internal jugular venous catheter tip in the lower SVC. No pneumothorax. 2. Additional stable support apparatus as above.  Electronically signed by: Pinkie Pebbles MD 12/30/2023 03:51 AM EDT RP Workstation: [id number]   CT HEAD WO CONTRAST ( ) Result Date: 12/30/2023 EXAM: CT HEAD WITHOUT CONTRAST 12/30/2023 03:04:28 AM TECHNIQUE: CT of the head was performed without the administration of intravenous contrast. Automated exposure control, iterative reconstruction, and/or weight based adjustment of the mA/kV was utilized to reduce the radiation dose to as low as reasonably achievable. COMPARISON: None available. CLINICAL HISTORY: Mental status change, persistent or worsening. Post CPR FINDINGS: BRAIN AND VENTRICLES: No acute hemorrhage. No evidence of acute infarct. No hydrocephalus. No extra-axial collection. No mass effect or midline shift. ORBITS: No acute abnormality. SINUSES: No acute abnormality. SOFT TISSUES AND SKULL: No acute soft tissue abnormality. No skull fracture. IMPRESSION: 1. No acute intracranial abnormality. Electronically signed by: Pinkie Pebbles MD 12/30/2023 03:07 AM EDT RP Workstation: [id number]   DG Abdomen 1 View Result Date: 12/30/2023 EXAM: 1 VIEW XRAY OF THE ABDOMEN 12/30/2023 01:50:55 AM COMPARISON: None available. CLINICAL HISTORY: OG placement. OG placement OG placement. OG placement. FINDINGS: LINES, TUBES AND DEVICES: Enteric tube terminates in the distal gastric body, satisfactory position. BOWEL: Nonobstructive bowel gas pattern. SOFT TISSUES: No opaque urinary calculi. BONES: No acute osseous abnormality. IMPRESSION: 1. Enteric tube terminates in the distal gastric body, in satisfactory position. Electronically signed by: Pinkie Pebbles MD 12/30/2023 01:53 AM EDT RP Workstation: [id number]   DG Chest Portable 1 View Result Date: 12/30/2023 EXAM: 1 VIEW XRAY OF THE CHEST 12/30/2023 01:03:41 AM COMPARISON: Chest x-ray 12/30/1998. CLINICAL HISTORY: intubation FINDINGS: LINES, TUBES AND DEVICES: Endotracheal tube tip measures 5 cm above the carina. Enteric tube extends below the  diaphragm. LUNGS AND PLEURA: There is some mild patchy opacities in the left lung base. No pulmonary edema. No  pleural effusion. No pneumothorax. HEART AND MEDIASTINUM: No acute abnormality of the cardiac and mediastinal silhouettes. BONES AND SOFT TISSUES: Cervical spine endplate abnormality is present. No acute osseous abnormality. IMPRESSION: 1. Mild patchy opacities in the left lung base may represent atelectasis or infection . 2. Endotracheal tube tip measures 5 cm above the carina. Electronically signed by: Greig Pique MD 12/30/2023 01:08 AM EDT RP Workstation: [id number]     Assessment and Plan: VF arrest  Elevated hsTi concerning for NSTEMI Presents with prolonged VF arrest with minimal preceding symptoms.  He has significant high-sensitivity troponin elevation which is dynamic but without ischemic changes on EKG and notably no STEMI.  Thus, no indication for emergent left heart catheterization overnight.  High-sensitivity troponin may be secondary to cardiac ischemia in the setting of prolonged arrest; however, given VF present during code and no clear etiology, will treat empirically for NSTEMI if no concern for stroke (CT head without hemorrhage).  Otherwise, agree with continuing amiodarone load and other postarrest care per the ICU team.  Would also obtain echocardiogram in the morning.  No evidence of pulmonary edema to suggest acute heart failure. - No emergent indication for PCI  - ASA 324mg , then start ASA 81mg  daily - Heparin per ACS protocol if no concern for stroke or major bleed - Continue amiodarone load as ordered  - Continue atorvastatin 40mg  every day - Keep NPO in case of need for LHC - TTE in AM - Lipids, A1c, TSH - Routine post arrest care per ICU team   Risk Assessment/Risk Scores:    TIMI Risk Score for Unstable Angina or Non-ST Elevation MI:   The patient's TIMI risk score is 1, which indicates a 5% risk of all cause mortality, new or recurrent myocardial  infarction or need for urgent revascularization in the next 14 days.         For questions or updates, please contact [facility] Please consult [email or web address] for contact info under      Signed, Chiquita ONEIDA Sauers, MD  12/30/2023 4:12 AM

## 2023-12-30 NOTE — Progress Notes (Signed)
 NAME:  James Castro, MRN:  [id number], DOB:  Sep 09, 1965, LOS: 0 ADMISSION DATE:  12/30/2023, CONSULTATION DATE:  12/30/23 REFERRING MD:  EDP, CHIEF COMPLAINT:  cardiac arrest   History of Present Illness:  James Castro is a 58 y.o. 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  She called 911 and started chest compressions, EMS found patient in Vfib and he underwent a total of 3 shocks,  3 epi pushes and amiodarone before ROSC, approximately 17 minutes.  Pt was intubated and in the ED was overbreathing the vent and had a gag reflex.  Wife stated he was more fatigued prior to this event, but otherwise seemed in his usual state of health  In the ED, pt's work-up was significant for troponin of 1853, AKI, mildly elevated transaminases and  lactic acid of 7.   Seen by cardiology, EKG does not show signs of STEMI  Pertinent  Medical History   has a past medical history of High cholesterol and Hypertension.   Significant Hospital Events: Including procedures, antibiotic start and stop dates in addition to other pertinent events   10/27 OOH cardiac arrest. Required pressors. CT head neg   Interim History / Subjective:  NE needs still at 9 mcg/min Sedated on prop and fent  Amiodarone at 30 mg/hr  Co-ox 75% Abg reviewed. Metabolic acidosis persists but lactate clearing 7.9 to 2.1 Objective    Blood pressure (!) 78/44, pulse 68, temperature 99 F (37.2 C), resp. rate (!) 22, height 5' 8 (1.727 m), weight 94.6 kg, SpO2 100%.    Vent Mode: PRVC FiO2 (%):  [40 %-100 %] 40 % Set Rate:  [18 bmp-25 bmp] 25 bmp Vt Set:  [550 mL] 550 mL PEEP:  [5 cmH20-8 cmH20] 5 cmH20 Plateau Pressure:  [19 cmH20] 19 cmH20   Intake/Output Summary (Last 24 hours) at 12/30/2023 0912 Last data filed at  12/30/2023 0700 Gross per 24 hour  Intake 1643.39 ml  Output 615 ml  Net 1028.39 ml   Filed Weights   12/30/23 0048 12/30/23 0315  Weight: 99 kg 94.6 kg   General sedated on propofol and fent, localizes more briskly on left  HENT NCAT left internal jugular CVL site unremarkable orally intubated PERRL Pulm clear PPat 21  Pcxr clear ett good position CVL good position no infiltrates Card rrr HR: 60 no MRG Ext warm brisk CR. No edema  Abd soft + diarrhea  Gu clear yellow Neuro sedated. Localizes briskly LUE, both LEs, opens eyes. A little slower to respond w/ RUE  Resolved problem list  Out-of-hospital VF arrest Assessment and Plan  Cardiogenic shock with lactic acidosis; status post VF arrest in setting of non-ST elevation MI -Currently lactate clearing Co. oximetry 75 -Troponin's 1853-->10214 Plan Keep euvolemic Titrate norepinephrine for mean arterial pressure greater than 65, currently at 9 mics per minute Repeat lactic acid x 1 Continue telemetry monitoring Trend Co. Oximetry Follow-up echocardiogram, he will need ischemia evaluation  Acute metabolic encephalopathy status postcardiac arrest Concern for anoxic injury, however current exam reassuring CT head negative EEG pending Plan Head of bed elevated Avoid fevers Ensure mean arterial pressure greater than 65 Keep euglycemic Serial neurochecks Follow-up EEG Continue aspirin and statin via tube Continue IV heparin  VF arrest Plan Continue amiodarone Telemetry  AKI Status  post cardiac arrest, renal indices already improving Plan Keep euvolemic Avoid nephrotoxics Strict intake and output A.m. chemistry  Elevated Transaminases Status post cardiac arrest Plan A.m. LFTs  Hyperglycemia Plan Sliding scale insulin  Leukocytosis Likely reactive Plan A.m. chest x-ray A.m. CBC  Fluid and electrolyte imbalance: hypomagnesemia  Plan Replace magnesium Follow lites  HTN Plan hold home medications     History of colon Ca -s/p hemicolectomy, under surveillance Plan F/u outpt    I personally  spent 32 minutes  on this patient which included: review of medical records, nursing notes, progress notes, evaluation, interpretation of lab data and diagnostic studies, taking independent history, performing exam, documenting plan, ordering diagnostics and interventions for the following critical care issues: Circulatory shock, Acute respiratory failure, Acute toxic and or metabolic encephalopathy with the following interventions which included: titration of ventilatory support, titration of hemodynamic drips to desired MAP        CRITICAL CARE

## 2023-12-30 NOTE — Progress Notes (Signed)
  Echocardiogram 2D Echocardiogram has been performed.  Norleen ORN Vinnie Gombert 12/30/2023, 1:38 PM

## 2023-12-31 ENCOUNTER — Inpatient Hospital Stay (HOSPITAL_COMMUNITY)

## 2023-12-31 ENCOUNTER — Encounter (HOSPITAL_COMMUNITY): Payer: Self-pay | Admitting: Cardiovascular Disease

## 2023-12-31 ENCOUNTER — Encounter (HOSPITAL_COMMUNITY): Admission: EM | Payer: Self-pay | Source: Home / Self Care

## 2023-12-31 DIAGNOSIS — I10 Essential (primary) hypertension: Secondary | ICD-10-CM

## 2023-12-31 DIAGNOSIS — I2510 Atherosclerotic heart disease of native coronary artery without angina pectoris: Secondary | ICD-10-CM

## 2023-12-31 DIAGNOSIS — I469 Cardiac arrest, cause unspecified: Secondary | ICD-10-CM | POA: Diagnosis not present

## 2023-12-31 DIAGNOSIS — R739 Hyperglycemia, unspecified: Secondary | ICD-10-CM

## 2023-12-31 DIAGNOSIS — D72829 Elevated white blood cell count, unspecified: Secondary | ICD-10-CM

## 2023-12-31 DIAGNOSIS — G9341 Metabolic encephalopathy: Secondary | ICD-10-CM

## 2023-12-31 DIAGNOSIS — J9601 Acute respiratory failure with hypoxia: Secondary | ICD-10-CM

## 2023-12-31 HISTORY — PX: LEFT HEART CATH AND CORONARY ANGIOGRAPHY: CATH118249

## 2023-12-31 LAB — CULTURE, BLOOD (ROUTINE X 2): Special Requests: ADEQUATE

## 2023-12-31 LAB — CBC
HCT: 39.7 % (ref 39.0–52.0)
Hemoglobin: 13.2 g/dL (ref 13.0–17.0)
MCH: 30.5 pg (ref 26.0–34.0)
MCHC: 33.2 g/dL (ref 30.0–36.0)
MCV: 91.7 fL (ref 80.0–100.0)
Platelets: 206 K/uL (ref 150–400)
RBC: 4.33 MIL/uL (ref 4.22–5.81)
RDW: 13.2 % (ref 11.5–15.5)
WBC: 11.4 K/uL — ABNORMAL HIGH (ref 4.0–10.5)
nRBC: 0 % (ref 0.0–0.2)

## 2023-12-31 LAB — COOXEMETRY PANEL
Carboxyhemoglobin: 0.3 % — ABNORMAL LOW (ref 0.5–1.5)
Methemoglobin: <0.7 % (ref 0.0–1.5)
O2 Saturation: 69.2 %
Total hemoglobin: 13.2 g/dL (ref 12.0–16.0)

## 2023-12-31 LAB — COMPREHENSIVE METABOLIC PANEL WITH GFR
ALT: 92 U/L — ABNORMAL HIGH (ref 0–44)
AST: 126 U/L — ABNORMAL HIGH (ref 15–41)
Albumin: 2.6 g/dL — ABNORMAL LOW (ref 3.5–5.0)
Alkaline Phosphatase: 75 U/L (ref 38–126)
Anion gap: 8 (ref 5–15)
BUN: 15 mg/dL (ref 6–20)
CO2: 22 mmol/L (ref 22–32)
Calcium: 7.6 mg/dL — ABNORMAL LOW (ref 8.9–10.3)
Chloride: 107 mmol/L (ref 98–111)
Creatinine, Ser: 1.11 mg/dL (ref 0.61–1.24)
GFR, Estimated: >60 mL/min (ref >60)
Glucose, Bld: 101 mg/dL — ABNORMAL HIGH (ref 70–99)
Potassium: 3.2 mmol/L — ABNORMAL LOW (ref 3.5–5.1)
Sodium: 137 mmol/L (ref 135–145)
Total Bilirubin: 0.6 mg/dL (ref 0.0–1.2)
Total Protein: 5 g/dL — ABNORMAL LOW (ref 6.5–8.1)

## 2023-12-31 LAB — MAGNESIUM: Magnesium: 2 mg/dL (ref 1.7–2.4)

## 2023-12-31 LAB — TRIGLYCERIDES: Triglycerides: 228 mg/dL — ABNORMAL HIGH (ref <150)

## 2023-12-31 LAB — GLUCOSE, CAPILLARY
Glucose-Capillary: 100 mg/dL — ABNORMAL HIGH (ref 70–99)
Glucose-Capillary: 105 mg/dL — ABNORMAL HIGH (ref 70–99)
Glucose-Capillary: 126 mg/dL — ABNORMAL HIGH (ref 70–99)
Glucose-Capillary: 128 mg/dL — ABNORMAL HIGH (ref 70–99)
Glucose-Capillary: 71 mg/dL (ref 70–99)
Glucose-Capillary: 87 mg/dL (ref 70–99)
Glucose-Capillary: 89 mg/dL (ref 70–99)
Glucose-Capillary: 96 mg/dL (ref 70–99)

## 2023-12-31 LAB — HEPARIN LEVEL (UNFRACTIONATED): Heparin Unfractionated: 0.38 [IU]/mL (ref 0.30–0.70)

## 2023-12-31 LAB — PHOSPHORUS: Phosphorus: 2.5 mg/dL (ref 2.5–4.6)

## 2023-12-31 SURGERY — LEFT HEART CATH AND CORONARY ANGIOGRAPHY
Anesthesia: LOCAL

## 2023-12-31 MED ORDER — VERAPAMIL HCL 2.5 MG/ML IV SOLN
INTRAVENOUS | Status: AC
  Filled 2023-12-31: qty 2

## 2023-12-31 MED ORDER — ACETAMINOPHEN 325 MG PO TABS: 650.0000 mg | ORAL_TABLET | Freq: Four times a day (QID) | ORAL | Status: DC | PRN

## 2023-12-31 MED ORDER — DOCUSATE SODIUM 100 MG PO CAPS: 100.0000 mg | ORAL_CAPSULE | Freq: Two times a day (BID) | ORAL | Status: DC | PRN

## 2023-12-31 MED ORDER — POTASSIUM CHLORIDE 20 MEQ PO PACK
40.0000 meq | PACK | ORAL | Status: AC
  Administered 2023-12-31 (×2): 40 meq
  Filled 2023-12-31 (×2): qty 2

## 2023-12-31 MED ORDER — ENOXAPARIN SODIUM 40 MG/0.4ML IJ SOSY: 40.0000 mg | PREFILLED_SYRINGE | INTRAMUSCULAR | Status: DC

## 2023-12-31 MED ORDER — ONDANSETRON HCL 4 MG/2ML IJ SOLN: 4.0000 mg | Freq: Four times a day (QID) | INTRAMUSCULAR | Status: DC | PRN

## 2023-12-31 MED ORDER — MIDAZOLAM HCL (PF) 2 MG/2ML IJ SOLN
INTRAMUSCULAR | Status: DC | PRN
  Administered 2023-12-31: 2 mg via INTRAVENOUS

## 2023-12-31 MED ORDER — FAMOTIDINE 20 MG PO TABS
20.0000 mg | ORAL_TABLET | Freq: Two times a day (BID) | ORAL | Status: DC
  Administered 2023-12-31 – 2024-01-01 (×2): 20 mg via ORAL
  Filled 2023-12-31 (×2): qty 1

## 2023-12-31 MED ORDER — ASPIRIN 81 MG PO CHEW: 81.0000 mg | CHEWABLE_TABLET | Freq: Every day | ORAL | Status: DC

## 2023-12-31 MED ORDER — IOHEXOL 350 MG/ML SOLN
INTRAVENOUS | Status: DC | PRN
  Administered 2023-12-31: 40 mL

## 2023-12-31 MED ORDER — ACETAMINOPHEN 500 MG PO TABS
500.0000 mg | ORAL_TABLET | Freq: Four times a day (QID) | ORAL | Status: DC
  Administered 2023-12-31 – 2024-01-01 (×4): 500 mg via ORAL
  Filled 2023-12-31 (×5): qty 1

## 2023-12-31 MED ORDER — ROSUVASTATIN CALCIUM 20 MG PO TABS
40.0000 mg | ORAL_TABLET | Freq: Every day | ORAL | Status: DC
  Administered 2024-01-01: 40 mg via ORAL
  Filled 2023-12-31: qty 2

## 2023-12-31 MED ORDER — OXYCODONE HCL 5 MG PO TABS
5.0000 mg | ORAL_TABLET | ORAL | Status: DC | PRN
  Administered 2024-01-01: 10 mg via ORAL
  Administered 2024-01-01: 5 mg via ORAL
  Filled 2023-12-31 (×2): qty 2

## 2023-12-31 MED ORDER — POTASSIUM CHLORIDE 20 MEQ PO PACK: 40.0000 meq | PACK | ORAL | Status: DC

## 2023-12-31 MED ORDER — POLYETHYLENE GLYCOL 3350 17 G PO PACK: 17.0000 g | PACK | Freq: Every day | ORAL | Status: DC | PRN

## 2023-12-31 MED ORDER — HEPARIN SODIUM (PORCINE) 1000 UNIT/ML IJ SOLN
INTRAMUSCULAR | Status: DC | PRN
  Administered 2023-12-31: 5000 [IU] via INTRAVENOUS

## 2023-12-31 MED ORDER — VERAPAMIL HCL 2.5 MG/ML IV SOLN
INTRAVENOUS | Status: DC | PRN
  Administered 2023-12-31: 10 mL via INTRA_ARTERIAL

## 2023-12-31 MED ORDER — ORAL CARE MOUTH RINSE
15.0000 mL | OROMUCOSAL | Status: DC | PRN
Start: 2023-12-31 — End: 2024-01-02

## 2023-12-31 MED ORDER — LIDOCAINE 5 % EX PTCH: 1.0000 | MEDICATED_PATCH | CUTANEOUS | Status: DC

## 2023-12-31 MED ORDER — OLANZAPINE 5 MG PO TBDP
5.0000 mg | ORAL_TABLET | Freq: Once | ORAL | Status: AC | PRN
  Administered 2023-12-31: 5 mg via ORAL
  Filled 2023-12-31: qty 1

## 2023-12-31 MED ORDER — HEPARIN (PORCINE) IN NACL 1000-0.9 UT/500ML-% IV SOLN
INTRAVENOUS | Status: DC | PRN
  Administered 2023-12-31 (×2): 500 mL

## 2023-12-31 MED ORDER — SODIUM CHLORIDE 0.9 % IV SOLN: 250.0000 mL | INTRAVENOUS | Status: AC | PRN

## 2023-12-31 MED ORDER — HEPARIN SODIUM (PORCINE) 1000 UNIT/ML IJ SOLN
INTRAMUSCULAR | Status: AC
  Filled 2023-12-31: qty 10

## 2023-12-31 MED ORDER — CLOPIDOGREL BISULFATE 75 MG PO TABS
75.0000 mg | ORAL_TABLET | Freq: Every day | ORAL | Status: DC
  Administered 2024-01-01: 75 mg via ORAL
  Filled 2023-12-31: qty 1

## 2023-12-31 MED ORDER — OLANZAPINE 5 MG PO TBDP
5.0000 mg | ORAL_TABLET | Freq: Every day | ORAL | Status: DC
  Filled 2023-12-31: qty 1

## 2023-12-31 MED ORDER — SODIUM CHLORIDE 0.9% FLUSH: 3.0000 mL | INTRAVENOUS | Status: DC | PRN

## 2023-12-31 MED ORDER — SODIUM CHLORIDE 0.9% FLUSH
3.0000 mL | Freq: Two times a day (BID) | INTRAVENOUS | Status: DC
  Administered 2023-12-31 – 2024-01-01 (×2): 3 mL via INTRAVENOUS

## 2023-12-31 MED ORDER — HEPARIN (PORCINE) 25000 UT/250ML-% IV SOLN
1250.0000 [IU]/h | INTRAVENOUS | Status: AC
  Administered 2024-01-01: 1250 [IU]/h via INTRAVENOUS

## 2023-12-31 MED ORDER — ROSUVASTATIN CALCIUM 20 MG PO TABS
40.0000 mg | ORAL_TABLET | Freq: Every day | ORAL | Status: DC
  Administered 2023-12-31: 40 mg

## 2023-12-31 MED ORDER — LIDOCAINE HCL (PF) 1 % IJ SOLN
INTRAMUSCULAR | Status: DC | PRN
  Administered 2023-12-31: 2 mL via INTRADERMAL

## 2023-12-31 MED ORDER — MIDAZOLAM HCL 2 MG/2ML IJ SOLN
INTRAMUSCULAR | Status: AC
  Filled 2023-12-31: qty 2

## 2023-12-31 SURGICAL SUPPLY — 14 items
CATH INFINITI 5 FR JL3.5 (CATHETERS) IMPLANT
CATH INFINITI AMBI 5FR JK (CATHETERS) IMPLANT
DEVICE RAD COMP TR BAND LRG (VASCULAR PRODUCTS) IMPLANT
ELECT DEFIB PAD ADLT CADENCE (PAD) IMPLANT
GLIDESHEATH SLEND SS 6F .021 (SHEATH) IMPLANT
GUIDEWIRE INQWIRE 1.5J.035X260 (WIRE) IMPLANT
KIT MICROPUNCTURE NIT STIFF (SHEATH) IMPLANT
MAT PREVALON FULL STRYKER (MISCELLANEOUS) IMPLANT
PACK CARDIAC CATHETERIZATION (CUSTOM PROCEDURE TRAY) ×1 IMPLANT
SET ATX-X65L (MISCELLANEOUS) IMPLANT
SHEATH PINNACLE 5F 10CM (SHEATH) IMPLANT
SHEATH PINNACLE 7F 10CM (SHEATH) IMPLANT
SHEATH PROBE COVER 6X72 (BAG) IMPLANT
SHEATH SUPER FLEX 6FR 11 (SHEATH) IMPLANT

## 2023-12-31 NOTE — Progress Notes (Signed)
 NAME:  James Castro, MRN:  [id number], DOB:  1966/02/12, LOS: 1 ADMISSION DATE:  12/30/2023, CONSULTATION DATE:  12/31/23 REFERRING MD:  EDP, CHIEF COMPLAINT:  cardiac arrest   History of Present Illness:  James Castro is a 58 y.o. 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  She called 911 and started chest compressions, EMS found patient in Vfib and he underwent a total of 3 shocks,  3 epi pushes and amiodarone before ROSC, approximately 17 minutes.  Pt was intubated and in the ED was overbreathing the vent and had a gag reflex.  Wife stated he was more fatigued prior to this event, but otherwise seemed in his usual state of health  In the ED, pt's work-up was significant for troponin of 1853, AKI, mildly elevated transaminases and  lactic acid of 7.   Seen by cardiology, EKG does not show signs of STEMI  Pertinent  Medical History   has a past medical history of High cholesterol and Hypertension.   Significant Hospital Events: Including procedures, antibiotic start and stop dates in addition to other pertinent events   10/27 OOH cardiac arrest. Required pressors. CT head neg  10/28 Intubated, following commands, plan for LHC later today   Interim History / Subjective:  No significant events overnight Patient on minimal sedation- fentanyl gtt  Following commands on vent   Plans for LHC later today   Objective    Blood pressure 116/69, pulse (!) 57, temperature 99.7 F (37.6 C), resp. rate (!) 25, height 5' 8 (1.727 m), weight 96.1 kg, SpO2 100%.    Vent Mode: PRVC FiO2 (%):  [40 %] 40 % Set Rate:  [25 bmp] 25 bmp Vt Set:  [550 mL] 550 mL PEEP:  [5 cmH20] 5 cmH20 Plateau Pressure:  [15 cmH20-19 cmH20] 16 cmH20   Intake/Output Summary (Last 24 hours) at 12/31/2023  9177 Last data filed at 12/31/2023 0700 Gross per 24 hour  Intake 2861.1 ml  Output 983 ml  Net 1878.1 ml   Filed Weights   12/30/23 0048 12/30/23 0315 12/31/23 0430  Weight: 99 kg 94.6 kg 96.1 kg   General: acute on chronic older adult male, lying in icu bed on vent  HEENT: Normocephalic, PERRLA intact, ETT, OG, poor dentition Pink MM CV: s1,s2, RRR, no MRG, No JVD pulm: clear, diminished, no distress on vent  Abs: bs active, soft  Extremities: no edema, no deformity, moves all extremities on command, b/L wrist restraints in place  Skin: no rash  Neuro: Rass -1, follows commands  GU: foley intact- making urine   Resolved problem list  Out-of-hospital VF arrest Assessment and Plan  Cardiogenic shock with lactic acidosis; status post VF arrest in setting of non-ST elevation MI VF Arrest  -Currently lactate clearing Co. oximetry 75 on 10/27 -Troponin's 1853-->10214-- >24,000 Echo 40-45%, LV mildly decreased function, demonstrates regional wall motion abnormalities- anterior wall/apex, Grade III diastolic dysfunction, RV systolic function mildly reduced, RV size normal, small pericardial effusion P: Currently off levophed, continue MAP goal > 65 Continue cardiac tele  Obtain Coox, trend Continue IV heparin  Plan for LHC later today with possible PCI  Continue amio gtt  Continue LTVV Continue VAP and PAD protocol, wean sedation as tolerated Will plan to wean patient after LHC today, if stable  from procedure, continue minimal sedation   Acute metabolic encephalopathy status postcardiac arrest Concern for anoxic injury, however current exam reassuring CT head negative Patient following commands, on minimal sedations P: Continue Neuroprotective measures: normothermia, normoxia, normocapnia, euglycemia, trend and monitor electrolytes Continue neurochecks  If patient stable after LHC today, plan for possible extubation   AKI Status post cardiac arrest, renal indices already  improving Resolving, patient making urine Hypomagnesemia P: Continue to trend renal function daily  Continue to monitor and optimize electrolytes daily Continue to monitor urine output Continue strict I/Os Continue Adequate renal perfusion  Avoid nephrotoxic agents  Repeat mag/phos   Elevated Transaminases Trending down P: Continue to trend daily, repeat tomorrow   Hyperglycemia Hgb A1c 5.3 on 10/27  P: Continue SSI  CBG q 4   Leukocytosis Likely reactive, trending down  Chest x-ray clear on morning exam P: Continue to trend WBC, fever curve  HTN P: Currently off levophed, SBP 90s, MAP >65 - continue to hold antihypertensives    History of colon Ca -s/p hemicolectomy, under surveillance P: F/u outpatient   CRITICAL CARE: 50 mins  Christian Julizza Sassone AGACNP-BC   [facility] 12/31/2023, 9:01 AM  Please see [email or web address] for pager details.  From 7A-7P if no response, please call [phone]. After hours, please call ELink [phone].

## 2023-12-31 NOTE — Progress Notes (Signed)
 PHARMACY - ANTICOAGULATION CONSULT NOTE  Pharmacy Consult for heparin Indication: s/p cardiac arrest >> concern for ACS  No Known Allergies  Patient Measurements: Height: 5' 8 (172.7 cm) Weight: 96.1 kg (211 lb 13.8 oz) IBW/kg (Calculated) : 68.4 HEPARIN DW (KG): 88.2  Vital Signs: Temp: 99.3 F (37.4 C) (10/28 1615) Temp Source: Bladder (10/28 1200) BP: 154/73 (10/28 1900) Pulse Rate: 65 (10/28 1454)  Labs: Recent Labs    12/30/23 0044 12/30/23 0054 12/30/23 0234 12/30/23 0337 12/30/23 0453 12/30/23 1027 12/30/23 1219 12/30/23 1430 12/31/23 0415  HGB 14.6  15.0  14.6   < >  --  16.1 16.0  --   --   --  13.2  HCT 46.1  44.0  43.0   < >  --  49.6 47.0  --   --   --  39.7  PLT 268  --   --  283  --   --   --   --  206  APTT 30  --   --   --   --   --   --   --   --   LABPROT 14.6  --   --   --   --   --   --   --   --   INR 1.1  --   --   --   --   --   --   --   --   HEPARINUNFRC  --   --   --   --   --   --  0.29*  0.34  --  0.38  CREATININE 1.36*  1.20  --  1.19 1.17  --   --   --  1.20 1.11  TROPONINIHS 1,853*  --  10,214*  --   --  >24,000*  --   --   --    < > = values in this interval not displayed.    Estimated Creatinine Clearance: 81.6 mL/min (by C-G formula based on SCr of 1.11 mg/dL).   Medical History: Past Medical History:  Diagnosis Date   High cholesterol    Hypertension     Medications:  See MAR  Assessment: 58yo male presented to ED s/p Vfib arrest, troponins found elevated and rising. 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  Listed under [facility].

## 2023-12-31 NOTE — Progress Notes (Addendum)
 Advanced Heart Failure Rounding Note  Cardiologist: None  Chief Complaint: OOH Arrest  Subjective:    Off pressors. Following commands, lightly sedated.  Plan for LHC with possible PCI today  Intubated/sedated. FiO2 40%.  Objective:    Weight Range: 96.1 kg Body mass index is 32.21 kg/m.   Vital Signs:   Temp:  [98.8 F (37.1 C)-100.2 F (37.9 C)] 99.9 F (37.7 C) (10/28 0700) Pulse Rate:  [46-57] 57 (10/28 0323) Resp:  [15-25] 25 (10/28 0700) BP: (84-144)/(59-105) 116/69 (10/28 0700) SpO2:  [89 %-100 %] 100 % (10/28 0700) FiO2 (%):  [40 %] 40 % (10/28 0323) Weight:  [96.1 kg] 96.1 kg (10/28 0430) Last BM Date : 12/30/23  Weight change: Filed Weights   12/30/23 0048 12/30/23 0315 12/31/23 0430  Weight: 99 kg 94.6 kg 96.1 kg   Intake/Output:  Intake/Output Summary (Last 24 hours) at 12/31/2023 0743 Last data filed at 12/31/2023 0700 Gross per 24 hour  Intake 2970.6 ml  Output 983 ml  Net 1987.6 ml    Physical Exam    General: Acutely-ill appearing. No distress on vent Cardiac: S1 and S2 present. No murmurs  Resp: Lung sounds clear and equal B/L Abdomen: Soft, non-tender, non-distended.  Extremities: Warm and dry.  No peripheral edema.  Neuro: lightly sedated  Telemetry   SR 50-60s, short runs NSVT (personally reviewed)  Labs    CBC Recent Labs    12/30/23 0044 12/30/23 0054 12/30/23 0337 12/30/23 0453 12/31/23 0415  WBC 15.7*  --  20.5*  --  11.4*  NEUTROABS 9.3*  --   --   --   --   HGB 14.6  15.0  14.6   < > 16.1 16.0 13.2  HCT 46.1  44.0  43.0   < > 49.6 47.0 39.7  MCV 96.0  --  92.9  --  91.7  PLT 268  --  283  --  206   < > = values in this interval not displayed.   Basic Metabolic Panel Recent Labs    89/72/74 0234 12/30/23 0337 12/30/23 0453 12/30/23 1430 12/31/23 0415  NA 140 140   < > 140 137  K 3.9 4.0   < > 4.3 3.2*  CL 109 106  --  108 107  CO2 21* 22  --  22 22  GLUCOSE 191* 180*  --  140* 101*  BUN 13 14   --  18 15  CREATININE 1.19 1.17  --  1.20 1.11  CALCIUM 7.7* 8.1*  --  7.8* 7.6*  MG 1.8 1.9  --  2.2  --   PHOS 4.2  --   --   --   --    < > = values in this interval not displayed.   Liver Function Tests Recent Labs    12/30/23 1430 12/31/23 0415  AST 187* 126*  ALT 126* 92*  ALKPHOS 88 75  BILITOT 0.6 0.6  PROT 5.6* 5.0*  ALBUMIN 3.0* 2.6*   BNP (last 3 results) Recent Labs    12/30/23 1027  BNP 368.9*   Hemoglobin A1C Recent Labs    12/30/23 0044  HGBA1C 5.3   Fasting Lipid Panel Recent Labs    12/30/23 0044 12/31/23 0415  CHOL 173  --   HDL 28*  --   LDLCALC 111*  --   TRIG 170* 228*  CHOLHDL 6.2  --    Thyroid Function Tests Recent Labs    12/30/23 1027  TSH  0.447   Medications:    Scheduled Medications:  aspirin  81 mg Per Tube Daily   Chlorhexidine Gluconate Cloth  6 each Topical Daily   famotidine  20 mg Per Tube BID   insulin aspart  0-15 Units Subcutaneous TID WC   mouth rinse  15 mL Mouth Rinse Q2H   rosuvastatin  40 mg Oral Daily   sodium chloride flush  3 mL Intravenous Q12H    Infusions:  sodium chloride     sodium chloride 75 mL/hr at 12/31/23 0717   amiodarone 30 mg/hr (12/31/23 0700)   fentaNYL infusion INTRAVENOUS 75 mcg/hr (12/31/23 0700)   heparin 1,250 Units/hr (12/31/23 0700)   norepinephrine (LEVOPHED) Adult infusion 1 mcg/min (12/31/23 0700)   propofol (DIPRIVAN) infusion 25 mcg/kg/min (12/31/23 0700)    PRN Medications: sodium chloride, docusate, midazolam PF, mouth rinse, polyethylene glycol, sodium chloride flush  Patient Profile   58 y.o. 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 Admitted after cardiac arrest.  Assessment/Plan   1. OOH VF Arrest - arrested in bed overnight, wife started CPR, EMS took over on arrival [17 minute before ROSC] defib x3 - intubated; vent per CCM - hstrop >  24k - Echo EF 40-45% distal anterior wall and apical HK  - purposeful movement on exam  - suspect LAD infarct - Plan to extubated post cath. Scheduled with IC this afternoon. D/w family at bedside.  2. CAD s/p NSTEMI - he has very subtle anterior ST elevation on ECG - hstrop> 24K - Continue heparin/ASA/statin  - no BB with bradycardia - Cath today  3. Ischemic CM/acute systolic HF - EF 59-54%, RWMA - LA 7.9>2.1>1.2 - off pressors NE and VP.  4. Acute hypoxic resp failure - on vent > management per CCM  5. NSVT - intermittent short runs overnight - continue IV amio 30/hr - Keep K> 4.0 Mg > 2.0   CRITICAL CARE Performed by: Jordan Lee  Total critical care time: 8 minutes  Critical care time was exclusive of separately billable procedures and treating other patients. Critical care was necessary to treat or prevent imminent or life-threatening deterioration. Critical care was time spent personally by me (independent of midlevel providers or residents) on the following activities: development of treatment plan with patient and/or surrogate as well as nursing, discussions with consultants, evaluation of patient's response to treatment, examination of patient, obtaining history from patient or surrogate, ordering and performing treatments and interventions, ordering and review of laboratory studies, ordering and review of radiographic studies, pulse oximetry and re-evaluation of patient's condition.  Length of Stay: 1  Jordan Lee, NP  12/31/23, 7:43 AM  Advanced Heart Failure Team Pager [phone] (M-F; 7a - 5p)  Please contact [facility] for night-coverage after hours (5p -7a ) and weekends on [email or web address]  Agree with above.   Remains intubated/sedated. Off pressors. On IV amio still with occasional NSVT.   LA has cleared.   For cath today. Suspect distal LAD infarct.   General:  Intubated sedated HEENT: normal + ETT Neck: supple. no JVD.  Cor: Regular rate & rhythm.  No rubs, gallops or murmurs. Lungs: clear Abdomen: soft, nontender, nondistended.Good bowel sounds. Extremities: no cyanosis, clubbing, rash, edema Neuro: intubated/sedated  For cath today. Continue IV amio. TItrate GDMT.   Extubate post cath.  CRITICAL CARE Performed by: Cherrie Sieving  Total critical care time: 37 minutes  Critical care time was exclusive of separately billable procedures and treating other patients.  Critical care was necessary to treat or prevent imminent or life-threatening deterioration.  Critical care was time spent personally by me (independent of midlevel providers or residents) on the following activities: development of treatment plan with patient and/or surrogate as well as nursing, discussions with consultants, evaluation of patient's response to treatment, examination of patient, obtaining history from patient or surrogate, ordering and performing treatments and interventions, ordering and review of laboratory studies, ordering and review of radiographic studies, pulse oximetry and re-evaluation of patient's condition.  Toribio Fuel, MD  8:38 AM

## 2023-12-31 NOTE — Progress Notes (Signed)
 eLink Physician-Brief Progress Note Patient Name: James Castro DOB: Nov 09, 1965 MRN: [id number]   Date of Service  12/31/2023  HPI/Events of Note  Patient with intermittent agitated delirium resulting on his pulling on his lines, currently calm and interactive with his daughter in the room.  eICU Interventions  PRN Zyprexa Zydis ordered for agitated delirium only.        Fawzi Melman U Jaqualin Serpa 12/31/2023, 11:07 PM

## 2023-12-31 NOTE — H&P (View-Only) (Signed)
 Advanced Heart Failure Rounding Note  Cardiologist: None  Chief Complaint: OOH Arrest  Subjective:    Off pressors. Following commands, lightly sedated.  Plan for LHC with possible PCI today  Intubated/sedated. FiO2 40%.  Objective:    Weight Range: 96.1 kg Body mass index is 32.21 kg/m.   Vital Signs:   Temp:  [98.8 F (37.1 C)-100.2 F (37.9 C)] 99.9 F (37.7 C) (10/28 0700) Pulse Rate:  [46-57] 57 (10/28 0323) Resp:  [15-25] 25 (10/28 0700) BP: (84-144)/(59-105) 116/69 (10/28 0700) SpO2:  [89 %-100 %] 100 % (10/28 0700) FiO2 (%):  [40 %] 40 % (10/28 0323) Weight:  [96.1 kg] 96.1 kg (10/28 0430) Last BM Date : 12/30/23  Weight change: Filed Weights   12/30/23 0048 12/30/23 0315 12/31/23 0430  Weight: 99 kg 94.6 kg 96.1 kg   Intake/Output:  Intake/Output Summary (Last 24 hours) at 12/31/2023 0743 Last data filed at 12/31/2023 0700 Gross per 24 hour  Intake 2970.6 ml  Output 983 ml  Net 1987.6 ml    Physical Exam    General: Acutely-ill appearing. No distress on vent Cardiac: S1 and S2 present. No murmurs  Resp: Lung sounds clear and equal B/L Abdomen: Soft, non-tender, non-distended.  Extremities: Warm and dry.  No peripheral edema.  Neuro: lightly sedated  Telemetry   SR 50-60s, short runs NSVT (personally reviewed)  Labs    CBC Recent Labs    12/30/23 0044 12/30/23 0054 12/30/23 0337 12/30/23 0453 12/31/23 0415  WBC 15.7*  --  20.5*  --  11.4*  NEUTROABS 9.3*  --   --   --   --   HGB 14.6  15.0  14.6   < > 16.1 16.0 13.2  HCT 46.1  44.0  43.0   < > 49.6 47.0 39.7  MCV 96.0  --  92.9  --  91.7  PLT 268  --  283  --  206   < > = values in this interval not displayed.   Basic Metabolic Panel Recent Labs    89/72/74 0234 12/30/23 0337 12/30/23 0453 12/30/23 1430 12/31/23 0415  NA 140 140   < > 140 137  K 3.9 4.0   < > 4.3 3.2*  CL 109 106  --  108 107  CO2 21* 22  --  22 22  GLUCOSE 191* 180*  --  140* 101*  BUN 13 14   --  18 15  CREATININE 1.19 1.17  --  1.20 1.11  CALCIUM 7.7* 8.1*  --  7.8* 7.6*  MG 1.8 1.9  --  2.2  --   PHOS 4.2  --   --   --   --    < > = values in this interval not displayed.   Liver Function Tests Recent Labs    12/30/23 1430 12/31/23 0415  AST 187* 126*  ALT 126* 92*  ALKPHOS 88 75  BILITOT 0.6 0.6  PROT 5.6* 5.0*  ALBUMIN 3.0* 2.6*   BNP (last 3 results) Recent Labs    12/30/23 1027  BNP 368.9*   Hemoglobin A1C Recent Labs    12/30/23 0044  HGBA1C 5.3   Fasting Lipid Panel Recent Labs    12/30/23 0044 12/31/23 0415  CHOL 173  --   HDL 28*  --   LDLCALC 111*  --   TRIG 170* 228*  CHOLHDL 6.2  --    Thyroid Function Tests Recent Labs    12/30/23 1027  TSH  0.447   Medications:    Scheduled Medications:  aspirin  81 mg Per Tube Daily   Chlorhexidine Gluconate Cloth  6 each Topical Daily   famotidine  20 mg Per Tube BID   insulin aspart  0-15 Units Subcutaneous TID WC   mouth rinse  15 mL Mouth Rinse Q2H   rosuvastatin  40 mg Oral Daily   sodium chloride flush  3 mL Intravenous Q12H    Infusions:  sodium chloride     sodium chloride 75 mL/hr at 12/31/23 0717   amiodarone 30 mg/hr (12/31/23 0700)   fentaNYL infusion INTRAVENOUS 75 mcg/hr (12/31/23 0700)   heparin 1,250 Units/hr (12/31/23 0700)   norepinephrine (LEVOPHED) Adult infusion 1 mcg/min (12/31/23 0700)   propofol (DIPRIVAN) infusion 25 mcg/kg/min (12/31/23 0700)    PRN Medications: sodium chloride, docusate, midazolam PF, mouth rinse, polyethylene glycol, sodium chloride flush  Patient Profile   58 y.o. 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 Admitted after cardiac arrest.  Assessment/Plan   1. OOH VF Arrest - arrested in bed overnight, wife started CPR, EMS took over on arrival [17 minute before ROSC] defib x3 - intubated; vent per CCM - hstrop >  24k - Echo EF 40-45% distal anterior wall and apical HK  - purposeful movement on exam  - suspect LAD infarct - Plan to extubated post cath. Scheduled with IC this afternoon. D/w family at bedside.  2. CAD s/p NSTEMI - he has very subtle anterior ST elevation on ECG - hstrop> 24K - Continue heparin/ASA/statin  - no BB with bradycardia - Cath today  3. Ischemic CM/acute systolic HF - EF 59-54%, RWMA - LA 7.9>2.1>1.2 - off pressors NE and VP.  4. Acute hypoxic resp failure - on vent > management per CCM  5. NSVT - intermittent short runs overnight - continue IV amio 30/hr - Keep K> 4.0 Mg > 2.0   CRITICAL CARE Performed by: Jordan Lee  Total critical care time: 8 minutes  Critical care time was exclusive of separately billable procedures and treating other patients. Critical care was necessary to treat or prevent imminent or life-threatening deterioration. Critical care was time spent personally by me (independent of midlevel providers or residents) on the following activities: development of treatment plan with patient and/or surrogate as well as nursing, discussions with consultants, evaluation of patient's response to treatment, examination of patient, obtaining history from patient or surrogate, ordering and performing treatments and interventions, ordering and review of laboratory studies, ordering and review of radiographic studies, pulse oximetry and re-evaluation of patient's condition.  Length of Stay: 1  Jordan Lee, NP  12/31/23, 7:43 AM  Advanced Heart Failure Team Pager [phone] (M-F; 7a - 5p)  Please contact [facility] for night-coverage after hours (5p -7a ) and weekends on [email or web address]  Agree with above.   Remains intubated/sedated. Off pressors. On IV amio still with occasional NSVT.   LA has cleared.   For cath today. Suspect distal LAD infarct.   General:  Intubated sedated HEENT: normal + ETT Neck: supple. no JVD.  Cor: Regular rate & rhythm.  No rubs, gallops or murmurs. Lungs: clear Abdomen: soft, nontender, nondistended.Good bowel sounds. Extremities: no cyanosis, clubbing, rash, edema Neuro: intubated/sedated  For cath today. Continue IV amio. TItrate GDMT.   Extubate post cath.  CRITICAL CARE Performed by: Cherrie Sieving  Total critical care time: 37 minutes  Critical care time was exclusive of separately billable procedures and treating other patients.  Critical care was necessary to treat or prevent imminent or life-threatening deterioration.  Critical care was time spent personally by me (independent of midlevel providers or residents) on the following activities: development of treatment plan with patient and/or surrogate as well as nursing, discussions with consultants, evaluation of patient's response to treatment, examination of patient, obtaining history from patient or surrogate, ordering and performing treatments and interventions, ordering and review of laboratory studies, ordering and review of radiographic studies, pulse oximetry and re-evaluation of patient's condition.  Toribio Fuel, MD  8:38 AM

## 2023-12-31 NOTE — Procedures (Signed)
 Extubation Procedure Note  Patient Details:   Name: James Castro DOB: [birth year] MRN: [id number]   Airway Documentation:    Vent end date: 12/31/23 Vent end time: 1820   Evaluation  O2 sats: stable throughout Complications: No apparent complications Patient did tolerate procedure well. Bilateral Breath Sounds: Clear, Diminished   Yes  Patient extubated per MD order. Positive cuff leak. No stridor noted. Vitals are stable on 2L [place]. RN at bedside.  [name] [name] 12/31/2023, 6:35 PM

## 2023-12-31 NOTE — Progress Notes (Signed)
 Pt was transported to cath lab and back to 2h20 without complications.

## 2023-12-31 NOTE — Interval H&P Note (Signed)
 History and Physical Interval Note:  12/31/2023 2:12 PM  James Castro  has presented today for surgery, with the diagnosis of vf arrest.  The various methods of treatment have been discussed with the patient and family. After consideration of risks, benefits and other options for treatment, the patient has consented to  Procedure(s): LEFT HEART CATH AND CORONARY ANGIOGRAPHY (N/A) as a surgical intervention.  The patient's history has been reviewed, patient examined, no change in status, stable for surgery.  I have reviewed the patient's chart and labs.  Questions were answered to the patient's satisfaction.     Royce Sciara

## 2023-12-31 NOTE — Progress Notes (Addendum)
 PHARMACY - ANTICOAGULATION CONSULT NOTE  Pharmacy Consult for heparin Indication: s/p cardiac arrest >> concern for ACS  No Known Allergies  Patient Measurements: Height: 5' 8 (172.7 cm) Weight: 96.1 kg (211 lb 13.8 oz) IBW/kg (Calculated) : 68.4 HEPARIN DW (KG): 88.2  Vital Signs: Temp: 99.7 F (37.6 C) (10/28 0744) Temp Source: Bladder (10/28 0000) BP: 116/69 (10/28 0700) Pulse Rate: 57 (10/28 0323)  Labs: Recent Labs    12/30/23 0044 12/30/23 0054 12/30/23 0234 12/30/23 0337 12/30/23 0453 12/30/23 1027 12/30/23 1219 12/30/23 1430 12/31/23 0415  HGB 14.6  15.0  14.6   < >  --  16.1 16.0  --   --   --  13.2  HCT 46.1  44.0  43.0   < >  --  49.6 47.0  --   --   --  39.7  PLT 268  --   --  283  --   --   --   --  206  APTT 30  --   --   --   --   --   --   --   --   LABPROT 14.6  --   --   --   --   --   --   --   --   INR 1.1  --   --   --   --   --   --   --   --   HEPARINUNFRC  --   --   --   --   --   --  0.29*  0.34  --  0.38  CREATININE 1.36*  1.20  --  1.19 1.17  --   --   --  1.20 1.11  TROPONINIHS 1,853*  --  10,214*  --   --  >24,000*  --   --   --    < > = values in this interval not displayed.    Estimated Creatinine Clearance: 81.6 mL/min (by C-G formula based on SCr of 1.11 mg/dL).   Medical History: Past Medical History:  Diagnosis Date   High cholesterol    Hypertension     Medications:  See MAR  Assessment: 58yo male presented to ED s/p Vfib arrest, troponins found elevated and rising which could be d/t ischemia in setting of arrest but c/f ACS as precipitating cause >> to begin heparin during further w/u.  12/31/23: Heparin level 0.38, therapeutic on heparin 1250 units/hr. No issues with infusion running or signs of bleeding per RN. CBC stable (Hgb 13.2, PLT 206).   Goal of Therapy:  Heparin level 0.3-0.7 units/ml Monitor platelets by anticoagulation protocol: Yes   Plan:  Continue heparin 1250 units/hr Monitor heparin level,  CBC, and s/sx of bleeding daily F/u heparin plan after LHC  Morna Breach, PharmD PGY2 Cardiology Pharmacy Resident 12/31/2023 7:45 AM

## 2024-01-01 ENCOUNTER — Telehealth (HOSPITAL_COMMUNITY): Payer: Self-pay

## 2024-01-01 ENCOUNTER — Other Ambulatory Visit (HOSPITAL_COMMUNITY): Payer: Self-pay

## 2024-01-01 DIAGNOSIS — R410 Disorientation, unspecified: Secondary | ICD-10-CM

## 2024-01-01 DIAGNOSIS — N179 Acute kidney failure, unspecified: Secondary | ICD-10-CM

## 2024-01-01 DIAGNOSIS — I2109 ST elevation (STEMI) myocardial infarction involving other coronary artery of anterior wall: Secondary | ICD-10-CM

## 2024-01-01 DIAGNOSIS — I469 Cardiac arrest, cause unspecified: Secondary | ICD-10-CM | POA: Diagnosis not present

## 2024-01-01 LAB — CBC
HCT: 37.8 % — ABNORMAL LOW (ref 39.0–52.0)
Hemoglobin: 12.6 g/dL — ABNORMAL LOW (ref 13.0–17.0)
MCH: 30.6 pg (ref 26.0–34.0)
MCHC: 33.3 g/dL (ref 30.0–36.0)
MCV: 91.7 fL (ref 80.0–100.0)
Platelets: 188 K/uL (ref 150–400)
RBC: 4.12 MIL/uL — ABNORMAL LOW (ref 4.22–5.81)
RDW: 13.2 % (ref 11.5–15.5)
WBC: 14.3 K/uL — ABNORMAL HIGH (ref 4.0–10.5)
nRBC: 0 % (ref 0.0–0.2)

## 2024-01-01 LAB — COMPREHENSIVE METABOLIC PANEL WITH GFR
ALT: 64 U/L — ABNORMAL HIGH (ref 0–44)
AST: 65 U/L — ABNORMAL HIGH (ref 15–41)
Albumin: 2.8 g/dL — ABNORMAL LOW (ref 3.5–5.0)
Alkaline Phosphatase: 76 U/L (ref 38–126)
Anion gap: 8 (ref 5–15)
BUN: 10 mg/dL (ref 6–20)
CO2: 22 mmol/L (ref 22–32)
Calcium: 8.1 mg/dL — ABNORMAL LOW (ref 8.9–10.3)
Chloride: 111 mmol/L (ref 98–111)
Creatinine, Ser: 1.19 mg/dL (ref 0.61–1.24)
GFR, Estimated: >60 mL/min (ref >60)
Glucose, Bld: 104 mg/dL — ABNORMAL HIGH (ref 70–99)
Potassium: 3.8 mmol/L (ref 3.5–5.1)
Sodium: 141 mmol/L (ref 135–145)
Total Bilirubin: 0.9 mg/dL (ref 0.0–1.2)
Total Protein: 5.5 g/dL — ABNORMAL LOW (ref 6.5–8.1)

## 2024-01-01 LAB — GLUCOSE, CAPILLARY
Glucose-Capillary: 105 mg/dL — ABNORMAL HIGH (ref 70–99)
Glucose-Capillary: 91 mg/dL (ref 70–99)
Glucose-Capillary: 94 mg/dL (ref 70–99)
Glucose-Capillary: 139 mg/dL — ABNORMAL HIGH (ref 70–99)

## 2024-01-01 LAB — PHOSPHORUS: Phosphorus: 3.2 mg/dL (ref 2.5–4.6)

## 2024-01-01 LAB — MAGNESIUM: Magnesium: 2.1 mg/dL (ref 1.7–2.4)

## 2024-01-01 MED ORDER — AMIODARONE HCL 200 MG PO TABS: 200.0000 mg | ORAL_TABLET | Freq: Two times a day (BID) | ORAL | Status: DC

## 2024-01-01 MED ORDER — ASPIRIN 81 MG PO TBEC
81.0000 mg | DELAYED_RELEASE_TABLET | Freq: Every day | ORAL | Status: DC
  Administered 2024-01-01: 81 mg via ORAL
  Filled 2024-01-01: qty 1

## 2024-01-01 MED ORDER — POTASSIUM CHLORIDE CRYS ER 20 MEQ PO TBCR
20.0000 meq | EXTENDED_RELEASE_TABLET | Freq: Once | ORAL | Status: AC
  Administered 2024-01-01: 20 meq via ORAL
  Filled 2024-01-01: qty 1

## 2024-01-01 MED ORDER — ENSURE PLUS HIGH PROTEIN PO LIQD
237.0000 mL | Freq: Two times a day (BID) | ORAL | Status: DC
  Administered 2024-01-01: 237 mL via ORAL

## 2024-01-01 MED ORDER — EMPAGLIFLOZIN 10 MG PO TABS
10.0000 mg | ORAL_TABLET | Freq: Every day | ORAL | Status: DC
  Administered 2024-01-01: 10 mg via ORAL
  Filled 2024-01-01: qty 1

## 2024-01-01 MED ORDER — AMIODARONE HCL 200 MG PO TABS
400.0000 mg | ORAL_TABLET | Freq: Two times a day (BID) | ORAL | Status: DC
  Administered 2024-01-01 (×2): 400 mg via ORAL
  Filled 2024-01-01 (×2): qty 2

## 2024-01-01 MED ORDER — SACUBITRIL-VALSARTAN 24-26 MG PO TABS
1.0000 | ORAL_TABLET | Freq: Two times a day (BID) | ORAL | Status: DC
  Administered 2024-01-01: 1 via ORAL
  Filled 2024-01-01 (×2): qty 1

## 2024-01-01 MED ORDER — MELATONIN 5 MG PO TABS
5.0000 mg | ORAL_TABLET | Freq: Once | ORAL | Status: AC
  Administered 2024-01-01: 5 mg via ORAL
  Filled 2024-01-01: qty 1

## 2024-01-01 MED ORDER — BANATROL TF EN LIQD
60.0000 mL | Freq: Two times a day (BID) | ENTERAL | Status: DC
Start: 2024-01-01 — End: 2024-01-02
  Administered 2024-01-01: 60 mL via ORAL
  Filled 2024-01-01: qty 60

## 2024-01-01 MED ORDER — SPIRONOLACTONE 25 MG PO TABS
25.0000 mg | ORAL_TABLET | Freq: Every day | ORAL | Status: DC
  Administered 2024-01-01: 25 mg via ORAL
  Filled 2024-01-01: qty 1

## 2024-01-01 MED ORDER — SACUBITRIL-VALSARTAN 49-51 MG PO TABS
1.0000 | ORAL_TABLET | Freq: Two times a day (BID) | ORAL | Status: DC
  Administered 2024-01-01: 1 via ORAL
  Filled 2024-01-01 (×2): qty 1

## 2024-01-01 MED ORDER — MELATONIN 5 MG PO TABS
5.0000 mg | ORAL_TABLET | Freq: Every evening | ORAL | Status: DC | PRN
Start: 2024-01-01 — End: 2024-01-02
  Filled 2024-01-01: qty 1

## 2024-01-01 NOTE — TOC Initial Note (Signed)
 Transition of Care [facility]) - Initial/Assessment Note    Patient Details  Name: James Castro MRN: [id number] Date of Birth: [birth year]  Transition of Care [facility] CM/SW Contact:    Justina Delcia Czar, RN Phone Number: [phone] 01/01/2024, 8:27 AM  Clinical Narrative:                   Spoke to pt's wife and dtr at bedside. Pt was independent pta.   Chart reviewed for discharge readiness, patient not medically stable for d/c. Inpatient CM/CSW will continue to monitor pt's advancement through interdisciplinary progression rounds.   If new pt transition needs arise, MD please place a TOC consult.       Patient Goals and CMS Choice      Expected Discharge Plan and Services   Discharge Planning Services: CM Consult   Living arrangements for the past 2 months: Single Family Home                    Prior Living Arrangements/Services Living arrangements for the past 2 months: Single Family Home Lives with:: Spouse    Activities of Daily Living      Permission Sought/Granted     Emotional Assessment      Admission diagnosis:  Cardiac arrest [facility]) [I46.9] Patient Active Problem List   Diagnosis Date Noted   Cardiac arrest (HCC) 12/30/2023   HYPERLIPIDEMIA 02/24/2009   HYPERTENSION 02/24/2009   ALLERGIC RHINITIS 02/24/2009   OSTEOARTHRITIS 02/24/2009   LOW BACK PAIN 02/24/2009   HEADACHE 02/24/2009   PCP:  Austin Mutton, MD Pharmacy:   [facility] [phone] - [place], [place] - [address] [place] [address] Phone: [phone] Fax: [phone]     Social Drivers of Health (SDOH) Social History: SDOH Screenings   Food Insecurity: Patient Unable To Answer (12/31/2023)  Housing: Patient Unable To Answer (12/31/2023)  Transportation Needs: Patient Unable To Answer (12/31/2023)  Utilities: Patient Unable To Answer (12/31/2023)  Tobacco Use: High Risk (12/31/2023)  Health Literacy: Medium Risk (03/29/2020)   Received from  [facility]   SDOH Interventions:     Readmission Risk Interventions     No data to display

## 2024-01-01 NOTE — Progress Notes (Signed)
 NAME:  James Castro, MRN:  [id number], DOB:  [birth year], LOS: 2 ADMISSION DATE:  12/30/2023, CONSULTATION DATE:  01/01/24 REFERRING MD:  EDP, CHIEF COMPLAINT:  cardiac arrest   History of Present Illness:  James Castro is a 58 y.o. 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  She called 911 and started chest compressions, EMS found patient in Vfib and he underwent a total of 3 shocks,  3 epi pushes and amiodarone before ROSC, approximately 17 minutes.  Pt was intubated and in the ED was overbreathing the vent and had a gag reflex.  Wife stated he was more fatigued prior to this event, but otherwise seemed in his usual state of health  In the ED, pt's work-up was significant for troponin of 1853, AKI, mildly elevated transaminases and  lactic acid of 7.   Seen by cardiology, EKG does not show signs of STEMI  Pertinent  Medical History   has a past medical history of High cholesterol and Hypertension.   Significant Hospital Events: Including procedures, antibiotic start and stop dates in addition to other pertinent events   10/27 OOH cardiac arrest. Required pressors. CT head neg  10/28 Intubated, following commands, plan for LHC later today  10/29 patient extubated successfully yesterday afternoon after LHC completed, patient having some delirium overnight but improving  Interim History / Subjective:  Delirium overnight, issues with sleeping Did receive as needed Zyprexa for agitation/delirium  Patient following commands, orientation improving per wife at bedside  Left heart cath Showing - proximal RCA lesion 30% stenosed, mid RCA lesion 20% stenosed, first MRG lesion 60% stenosed, proximal LAD to mid LAD lesion 40% stenosed, mid LAD lesion 100% stenosed   Objective    Blood  pressure (!) 148/77, pulse 65, temperature (!) 97.5 F (36.4 C), temperature source Oral, resp. rate 17, height 5' 8 (1.727 m), weight 93.2 kg, SpO2 90%.    Vent Mode: PRVC FiO2 (%):  [40 %] 40 % Set Rate:  [20 bmp-25 bmp] 20 bmp Vt Set:  [550 mL] 550 mL PEEP:  [5 cmH20] 5 cmH20 Plateau Pressure:  [21 cmH20] 21 cmH20   Intake/Output Summary (Last 24 hours) at 01/01/2024 1035 Last data filed at 01/01/2024 1000 Gross per 24 hour  Intake 941.78 ml  Output 3175 ml  Net -2233.22 ml   Filed Weights   12/30/23 0315 12/31/23 0430 01/01/24 0356  Weight: 94.6 kg 96.1 kg 93.2 kg   Examination  General: Acute on chronic adult male sitting up in ICU bed NAD, pleasant HEENT: Normocephalic, PERRL intact, poor dentition, pink MM Pulm: Clear, diminished, on room air, in no distress CV: S1-S2, RRR, no MRG no JVD Abdomen: Bowel sounds soft, active, Flexi-Seal in place-liquid stool output Extremities: Moves all extremities with ease and upon commands Skin: No rashes GU: Male PureWick in place Neuro: Alert orient x 3, disoriented to time, follows commands   Resolved problem list  Out-of-hospital VF arrest Assessment and Plan  Anterior infarct-mid to distal LAD, one-vessel CAD s/p heart cath Cardiogenic shock with lactic acidosis; status post VF arrest in setting of non-ST elevation MI VF Arrest  -Currently lactate clearing Co. oximetry 75 on 10/27 -Troponin's 1853-->10214-- >24,000 Echo 40-45%, LV mildly decreased function, demonstrates regional wall motion abnormalities- anterior wall/apex, Grade III diastolic dysfunction, RV  systolic function mildly reduced, RV size normal, small pericardial effusion Completed anterior infarct no benefit from revascularization per cardiology standpoint, P: Patient successfully extubated status post left heart cath, on room air Currently on Amio infusion-transition to amnio p.o. when able Continue cardiac telemetry Continue aspirin, statin, Plavix Will  be started on GDMT per advanced heart failure team, appreciate assistance and recommendations-Entresto, Aldactone, Jardiance Continue mobilization, pulmonary hygiene, incentive spirometer  Acute metabolic encephalopathy status postcardiac arrest Delirium Concern for anoxic injury, however current exam reassuring CT head negative Patient having some agitation and delirium overnight but now improved P: Initiate delirium precautions Mobilize as tolerated PT OT evaluation Limit sedating medications Will hold off further Zyprexa and adding Seroquel at bedtime for now Continue melatonin  AKI Status post cardiac arrest, renal indices already improving Resolving, patient making urine Hypomagnesemia-improved P: Continue to trend renal function daily  Continue to monitor and optimize electrolytes daily Continue to monitor urine output Continue strict I/Os Continue Adequate renal perfusion  Avoid nephrotoxic agents  Foley removed-continue to monitor output with male PureWick  Elevated Transaminases Continue to trend downward P: Can continue to trend intermittently  Hyperglycemia Hgb A1c 5.3 on 10/27  P: Continue SSI CBGs Q4  Leukocytosis Likely reactive?,  Slight increase in WBCs 14.3 from 11.4, no fever at this time Chest x-ray clear on morning exam P: Continue to trend WBCs/fever curve  HTN P: Starting GDMT therapy for advanced heart failure Continue cardiac telemonitoring and blood pressure monitoring  History of colon Ca -s/p hemicolectomy, under surveillance P: Follow-up outpatient  Total time: 50 minutes  Christian Kilo Eshelman AGACNP-BC   [facility] 01/01/2024, 10:50 AM  Please see [email or web address] for pager details.  From 7A-7P if no response, please call [phone]. After hours, please call ELink [phone].

## 2024-01-01 NOTE — Evaluation (Signed)
 Physical Therapy Evaluation Patient Details Name: James Castro MRN: [id number] DOB: 03-Jul-1965 Today's Date: 01/01/2024  History of Present Illness  Pt is 58 yo presenting [facility] on 10/27 due to cardiac arrest. Pt was intubated on 10/27 in ED and extubated on 10/28 after LHC. PMH: HTN, HL, stage III cecum adenocarcinoma s/p hemicolectomy and CAPEOX.  Clinical Impression  Pt is currently mod I for bed mobility, supervision for sit to stand and CGA for long distance gait without an AD. Pt seems to have some decrease in safety awareness. 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 Spouse corroborates story. Denies falls except 1 fall 5 years ago when he fell off a roof and broke L ankle. Due to pt current functional status, home set up and available assistance at home no recommended skilled physical therapy services at this time on discharge from acute care hospital setting. Will continue to follow in acute setting in order to ensure that pt returns home with decreased risk for falls, injury, re-hospitalization and improved activity tolerance.        If plan is discharge home, recommend the following: Assistance with cooking/housework;Help with stairs or ramp for entrance;Assist for transportation     Equipment Recommendations None recommended by PT     Functional Status Assessment Patient has had a recent decline in their functional status and demonstrates the ability to make significant improvements in function in a reasonable and predictable amount of time.     Precautions / Restrictions Precautions Precautions: Fall Recall of Precautions/Restrictions: Impaired Restrictions Weight Bearing Restrictions Per Provider Order: No      Mobility  Bed Mobility Overal bed mobility: Modified Independent             General bed mobility comments: sitting to supine     Transfers Overall transfer level: Needs assistance Equipment used: None Transfers: Sit to/from Stand Sit to Stand: Supervision           General transfer comment: supervision for safety    Ambulation/Gait Ambulation/Gait assistance: Contact guard assist Gait Distance (Feet): 500 Feet Assistive device: None Gait Pattern/deviations: Step-through pattern, Antalgic, Decreased stance time - left, Decreased dorsiflexion - right, Decreased dorsiflexion - left, Decreased weight shift to left, Trunk flexed Gait velocity: slightly decreased Gait velocity interpretation: <1.8 ft/sec, indicate of risk for recurrent falls   General Gait Details: antalgic gait pattern with anterior flexion of the trunk with COM slightly over BOS with quick cadence.     Balance Overall balance assessment: Mild deficits observed, not formally tested (pt able to stand at toilet without UE Support, no overt LOB with gait with no UE Support)         Pertinent Vitals/Pain Pain Assessment Pain Assessment: 0-10 Pain Score: 6  Pain Location: chest Pain Descriptors / Indicators: Aching Pain Intervention(s): Monitored during session    Home Living Family/patient expects to be discharged to:: Private residence Living Arrangements: Spouse/significant other Available Help at Discharge: Family;Available 24 hours/day Type of Home: House Home Access: Stairs to enter Entrance Stairs-Rails: Can reach both;Left;Right Entrance Stairs-Number of Steps: 5-6   Home Layout: One level Home Equipment: Standard Walker;Other (comment) Additional Comments: knee scooter    Prior Function Prior Level of Function : Independent/Modified Independent;Working/employed;Driving             Mobility Comments: pt ambulates without an AD, takes time to warm up in the morning  and has had an antalgic gait after breaking his L ankle. 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 Could postentially be baseline Following commands: Intact       Cueing Cueing Techniques: Verbal cues, Tactile cues, Visual cues     General Comments General comments (skin integrity, edema, etc.): HR/O2 sats remained WNL during activity. BP 155/79 after mobility 145/79 prior to mobility. SPouse present in room during session        Assessment/Plan    PT Assessment Patient needs continued PT services  PT Problem List Decreased balance;Decreased safety awareness       PT Treatment Interventions DME instruction;Balance training;Gait training;Stair training;Functional mobility training;Patient/family education;Therapeutic activities;Therapeutic exercise    PT Goals (Current goals can be found in the Care Plan section)  Acute Rehab PT Goals Patient Stated Goal: to go home tomorrow PT Goal Formulation: With patient/family Time For Goal Achievement: 01/15/24 Potential to Achieve Goals: Good    Frequency Min 1X/week        AM-PAC PT 6 Clicks Mobility  Outcome Measure Help needed turning from your back to your side while in a flat bed without using bedrails?: None Help needed moving from lying on your back to sitting on the side of a flat bed without using bedrails?: None Help needed moving to and from a bed to a chair (including a wheelchair)?: A Little Help needed standing up from a chair using  your arms (e.g., wheelchair or bedside chair)?: A Little Help needed to walk in hospital room?: A Little Help needed climbing 3-5 steps with a railing? : A Little 6 Click Score: 20    End of Session Equipment Utilized During Treatment: Gait belt Activity Tolerance: Patient tolerated treatment well Patient left: in bed;with call bell/phone within reach;with bed alarm set;with family/visitor present Nurse Communication: Mobility status PT Visit Diagnosis: Unsteadiness on feet (R26.81);Other abnormalities of gait and mobility (R26.89)    Time: 8449-8385 PT Time Calculation (min) (ACUTE ONLY): 24 min   Charges:   PT Evaluation $PT Eval Low Complexity: 1 Low PT Treatments $Therapeutic Activity: 8-22 mins PT General Charges $$ ACUTE PT VISIT: 1 Visit         Dorothyann Maier, DPT, CLT  Acute Rehabilitation Services Office: [phone] (Secure chat preferred)   Dorothyann VEAR Maier 01/01/2024, 4:28 PM

## 2024-01-01 NOTE — Progress Notes (Addendum)
 Advanced Heart Failure Rounding Note  Cardiologist: None  Chief Complaint: NSTEMI Subjective:    LHC: single vessel disease; 100% midLAD. Medically manage.   SR 60, NSVT improving. On amio 30/hr.  Extubated after cath.   Sitting up in bed. Feeling well. No shortness of breath or chest pain. Needs to ambulate.   Objective:    Weight Range: 93.2 kg Body mass index is 31.24 kg/m.   Vital Signs:   Temp:  [99 F (37.2 C)-100.2 F (37.9 C)] 99.8 F (37.7 C) (10/28 2334) Pulse Rate:  [60-84] 65 (10/28 1454) Resp:  [13-26] 18 (10/29 0730) BP: (84-164)/(57-113) 124/65 (10/29 0730) SpO2:  [90 %-100 %] 93 % (10/29 0730) FiO2 (%):  [40 %] 40 % (10/28 1515) Weight:  [93.2 kg] 93.2 kg (10/29 0356) Last BM Date : 12/31/23  Weight change: Filed Weights   12/30/23 0315 12/31/23 0430 01/01/24 0356  Weight: 94.6 kg 96.1 kg 93.2 kg   Intake/Output:  Intake/Output Summary (Last 24 hours) at 01/01/2024 0853 Last data filed at 01/01/2024 0730 Gross per 24 hour  Intake 1254.31 ml  Output 3220 ml  Net -1965.69 ml    Physical Exam    General: Elderly, pale appearing. No distress on RA Cardiac: JVP flat. S1 and S2 present. No murmurs Resp: Lung sounds clear and equal B/L Abdomen: Soft, non-tender, distended.  Extremities: Warm and dry.  No peripheral edema.  Neuro: Alert and oriented x3. Affect  flat.   Telemetry   SR 50-60s, NSVT runs less frequent (personally reviewed)  Labs    CBC Recent Labs    12/30/23 0044 12/30/23 0054 12/31/23 0415 01/01/24 0344  WBC 15.7*   < > 11.4* 14.3*  NEUTROABS 9.3*  --   --   --   HGB 14.6  15.0  14.6   < > 13.2 12.6*  HCT 46.1  44.0  43.0   < > 39.7 37.8*  MCV 96.0   < > 91.7 91.7  PLT 268   < > 206 188   < > = values in this interval not displayed.   Basic Metabolic Panel Recent Labs    89/71/74 0415 12/31/23 1100 01/01/24 0344  NA 137  --  141  K 3.2*  --  3.8  CL 107  --  111  CO2 22  --  22  GLUCOSE 101*  --   104*  BUN 15  --  10  CREATININE 1.11  --  1.19  CALCIUM 7.6*  --  8.1*  MG  --  2.0 2.1  PHOS  --  2.5 3.2   Liver Function Tests Recent Labs    12/31/23 0415 01/01/24 0344  AST 126* 65*  ALT 92* 64*  ALKPHOS 75 76  BILITOT 0.6 0.9  PROT 5.0* 5.5*  ALBUMIN 2.6* 2.8*   BNP (last 3 results) Recent Labs    12/30/23 1027  BNP 368.9*   Hemoglobin A1C Recent Labs    12/30/23 0044  HGBA1C 5.3   Fasting Lipid Panel Recent Labs    12/30/23 0044 12/31/23 0415  CHOL 173  --   HDL 28*  --   LDLCALC 111*  --   TRIG 170* 228*  CHOLHDL 6.2  --    Thyroid Function Tests Recent Labs    12/30/23 1027  TSH 0.447   Medications:    Scheduled Medications:  acetaminophen  500 mg Oral Q6H   [START ON 01/02/2024] aspirin  81 mg Oral Daily  Chlorhexidine Gluconate Cloth  6 each Topical Daily   clopidogrel  75 mg Oral Q breakfast   famotidine  20 mg Oral BID   insulin aspart  0-15 Units Subcutaneous TID WC   lidocaine  1 patch Transdermal Q24H   rosuvastatin  40 mg Oral Daily   sodium chloride flush  3 mL Intravenous Q12H    Infusions:  sodium chloride     amiodarone 30 mg/hr (01/01/24 0700)   norepinephrine (LEVOPHED) Adult infusion 0 mcg/min (12/31/23 1500)    PRN Medications: sodium chloride, acetaminophen, docusate sodium, ondansetron (ZOFRAN) IV, mouth rinse, oxyCODONE, polyethylene glycol, sodium chloride flush  Patient Profile   58 y.o. 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 Admitted after cardiac arrest.  Assessment/Plan   1. OOH VF Arrest - arrested in bed overnight, wife started CPR, EMS took over on arrival [17 minute before ROSC] defib x3 - hstrop > 24k; LAD infarct - extubated; neuro intact  2. CAD s/p NSTEMI - he has very subtle anterior ST elevation on ECG - hstrop> 24K - no BB with bradycardia - LHC with single vessel  disease; 100% mid LAD occlusion - medical management - continue asa/plavix  3. Ischemic CM/acute HFmrEF - EF 40-45%, RWMA - EF may improve slightly further out from arrest - Off pressors - start jardiance 10 mg daily - start spiro 25 mg daily - start entresto 24/26 mg bid today - rate have been low, hold on BB. Can likely add tomorrow - will need LifeVest at discharge  4. Acute hypoxic resp failure - extubated on Jed[place] - IS  5. NSVT - intermittent short runs; improving - switch amio to PO - Keep K> 4.0 Mg > 2.0   Anticipate transfer out of ICU today, will discuss with Dr. Leeasia Secrist.  Length of Stay: 2  Jordan Lee, NP  01/01/24, 8:53 AM  Advanced Heart Failure Team Pager [phone] (M-F; 7a - 5p)  Please contact [facility] for night-coverage after hours (5p -7a ) and weekends on [email or web address]  Patient seen and examined with the above-signed Advanced Practice Provider and/or Housestaff. I personally reviewed laboratory data, imaging studies and relevant notes. I independently examined the patient and formulated the important aspects of the plan. I have edited the note to reflect any of my changes or salient points. I have personally discussed the plan with the patient and/or family.  Cath films reviewed. He has totalled mid LAD.   Now extubated. Following commands.  Still with occasional NSVT  General:  Sitting up in bed. No resp difficulty HEENT: normal Neck: supple. no JVD.  Cor: Regular rate & rhythm. No rubs, gallops or murmurs. Lungs: clear Abdomen: soft, nontender, nondistended.Good bowel sounds. Extremities: no cyanosis, clubbing, rash, edema Neuro: alert & orientedx3, cranial nerves grossly intact. moves all 4 extremities w/o difficulty. Affect pleasant  Can go to floor on team C. Switch amio to po. Titrate GDMT.  Will order short-term Lifevest.   Toribio Fuel, MD  11:14 AM

## 2024-01-01 NOTE — Progress Notes (Signed)
 Pt transferred to 6E10 via wheelchair on telemetry monitoring. Pt's VSS and pt tolerated transfer well. Pt assisted to bed, and received by 6E RN.

## 2024-01-01 NOTE — Telephone Encounter (Signed)
 Pharmacy Patient Advocate Encounter  Insurance verification completed.    The patient is insured through [employer]. Patient has Medicare and is not eligible for a copay card, but may be able to apply for patient assistance or Medicare RX Payment Plan (Patient Must reach out to their plan, if eligible for payment plan), if available.    Ran test claim for Farxiga 10mg  tablets and the current 30 day co-pay is $253.82  Ran test claim for Jardiance 10mg  tablets and the current 30 day co-pay is $47.00.   This test claim was processed through [facility]- copay amounts may vary at other pharmacies due to pharmacy/plan contracts, or as the patient moves through the different stages of their insurance plan.

## 2024-01-02 ENCOUNTER — Telehealth (HOSPITAL_COMMUNITY): Payer: Self-pay

## 2024-01-02 ENCOUNTER — Encounter: Payer: Self-pay | Admitting: Internal Medicine

## 2024-01-02 LAB — BASIC METABOLIC PANEL WITH GFR
Anion gap: 15 (ref 5–15)
BUN: 11 mg/dL (ref 6–20)
CO2: 19 mmol/L — ABNORMAL LOW (ref 22–32)
Calcium: 8.3 mg/dL — ABNORMAL LOW (ref 8.9–10.3)
Chloride: 105 mmol/L (ref 98–111)
Creatinine, Ser: 1.22 mg/dL (ref 0.61–1.24)
GFR, Estimated: >60 mL/min (ref >60)
Glucose, Bld: 77 mg/dL (ref 70–99)
Potassium: 3.6 mmol/L (ref 3.5–5.1)
Sodium: 139 mmol/L (ref 135–145)

## 2024-01-02 LAB — CBC
HCT: 38.9 % — ABNORMAL LOW (ref 39.0–52.0)
Hemoglobin: 12.9 g/dL — ABNORMAL LOW (ref 13.0–17.0)
MCH: 30.1 pg (ref 26.0–34.0)
MCHC: 33.2 g/dL (ref 30.0–36.0)
MCV: 90.7 fL (ref 80.0–100.0)
Platelets: 183 K/uL (ref 150–400)
RBC: 4.29 MIL/uL (ref 4.22–5.81)
RDW: 13.2 % (ref 11.5–15.5)
WBC: 13.5 K/uL — ABNORMAL HIGH (ref 4.0–10.5)
nRBC: 0 % (ref 0.0–0.2)

## 2024-01-02 LAB — LIPOPROTEIN A (LPA): Lipoprotein (a): <8.4 nmol/L (ref <75.0)

## 2024-01-02 MED ORDER — EMPAGLIFLOZIN 10 MG PO TABS: 10.0000 mg | ORAL_TABLET | Freq: Every day | ORAL | 0 refills | Status: DC

## 2024-01-02 MED ORDER — ASPIRIN 81 MG PO TBEC: 81.0000 mg | DELAYED_RELEASE_TABLET | Freq: Every day | ORAL | 0 refills | Status: DC

## 2024-01-02 MED ORDER — SPIRONOLACTONE 25 MG PO TABS: 25.0000 mg | ORAL_TABLET | Freq: Every day | ORAL | 0 refills | Status: DC

## 2024-01-02 MED ORDER — SACUBITRIL-VALSARTAN 49-51 MG PO TABS: 1.0000 | ORAL_TABLET | Freq: Two times a day (BID) | ORAL | 0 refills | Status: DC

## 2024-01-02 MED ORDER — AMIODARONE HCL 200 MG PO TABS: ORAL_TABLET | ORAL | 0 refills | Status: DC

## 2024-01-02 MED ORDER — CLOPIDOGREL BISULFATE 75 MG PO TABS
75.0000 mg | ORAL_TABLET | Freq: Every day | ORAL | 0 refills | Status: DC
Start: 2024-01-02 — End: 2024-01-29

## 2024-01-02 MED ORDER — ROSUVASTATIN CALCIUM 40 MG PO TABS: 40.0000 mg | ORAL_TABLET | Freq: Every day | ORAL | 0 refills | Status: DC

## 2024-01-02 MED ORDER — NITROGLYCERIN 0.4 MG SL SUBL: 0.4000 mg | SUBLINGUAL_TABLET | SUBLINGUAL | 0 refills | Status: AC | PRN

## 2024-01-02 NOTE — Telephone Encounter (Signed)
 Pt insurance is active and benefits verified through [facility] Co-pay $25, DED 0/0 met, out of pocket $6,750/0 met, co-insurance 0%. no pre-authorization required. Passport, 01/02/2024@2 :50, REF# [id number]   TCR/ICR? ICR Visit(date of service)limitation? No limit Can multiple codes be used on the same date of service/visit?(IF ITS A LIMIT) n/a    Is this a lifetime maximum or an annual maximum? annual Has the member used any of these services to date? no Is there a time limit (weeks/months) on start of program and/or program completion? no   Will contact patient to see if he is interested in the Cardiac Rehab Program. If interested, patient will need to complete follow up appt. Once completed, patient will be contacted for scheduling upon review by the RN Navigator.

## 2024-01-02 NOTE — Progress Notes (Signed)
 eLink Physician-Brief Progress Note Patient Name: James Castro DOB: 1965/07/19 MRN: [id number]   Date of Service  01/02/2024  HPI/Events of Note  Patient threatening to sign out AMA despite recent cardiac arrest.  eICU Interventions  Patient persuaded to defer signing out AMA.        Mehak Roskelley U Natha Guin 01/02/2024, 4:47 AM

## 2024-01-02 NOTE — Progress Notes (Signed)
 eLink Physician-Brief Progress Note Patient Name: James Castro DOB: Mar 08, 1965 MRN: [id number]   Date of Service  01/02/2024  HPI/Events of Note  Patient insisting on leaving the hospital against medical advice despite detailed and explicit explanation of the attendant risk, including the possibility of cardiac arrest and death.  eICU Interventions  Bedside RN instructed to present him with the AMA form if he insists on leaving despite our efforts to convince him otherwise.        Alishba Naples U Quentin Strebel 01/02/2024, 6:45 AM

## 2024-01-02 NOTE — Telephone Encounter (Signed)
 Called patient to see if he is interested in the Cardiac Rehab Program. Patient expressed interest. Explained scheduling process and went over insurance, patient verbalized understanding. Will contact patient for scheduling once f/u has been completed.

## 2024-01-02 NOTE — Telephone Encounter (Signed)
 Updated discharge medication list after discussion with Dr Bensimhon.   Thank you for allowing pharmacy to participate in this patient's care,  Suzen Sour, PharmD, BCCCP Clinical Pharmacist

## 2024-01-02 NOTE — Progress Notes (Signed)
 Pt is upset that wife left and insists to leave and go find her. Patient advised and encouraged multiple times to please wait until MD is able to come to bedside to talk to him. Pt insisting he is leaving. Pt has already removed telemetry and his own IV. Md notified. MD spoke to patient over the phone. Pt told MD he is leaving. MD advised this RN to have patient to sign AMA paperwork.

## 2024-01-02 NOTE — Progress Notes (Signed)
 Pt becoming upset and aggressive and demanding to leave so that he may smoke a cigarette. Patient threatening to leave AMA. MD notified.

## 2024-01-02 NOTE — Telephone Encounter (Signed)
 Error

## 2024-01-03 LAB — CULTURE, BLOOD (ROUTINE X 2): Special Requests: ADEQUATE

## 2024-01-07 NOTE — Progress Notes (Unsigned)
 Cardiology Clinic Note   Patient Name: James Castro Date of Encounter: 01/10/2024  Primary Care Provider:  Austin Mutton, MD Primary Cardiologist:  None  Patient Profile    James Castro 58 year old male presents to the clinic today for his ventricular fibrillation and coronary artery disease.  Past Medical History    Past Medical History:  Diagnosis Date   High cholesterol    Hypertension    Past Surgical History:  Procedure Laterality Date   LEFT HEART CATH AND CORONARY ANGIOGRAPHY N/A 12/31/2023   Procedure: LEFT HEART CATH AND CORONARY ANGIOGRAPHY;  Surgeon: Darron Deatrice LABOR, MD;  Location: [facility];  Service: Cardiovascular;  Laterality: N/A;    Allergies  No Known Allergies  History of Present Illness    James Castro has a PMH of NSTEMI (proximal RCA 30%, mid RCA 20%, first marginal 60%, proximal LAD-mid LAD 40%, and mid LAD 100% with collateral flow) medical management was recommended.  His PMH also includes ischemic cardiomyopathy, acute HFmrEF, acute hypoxic respiratory failure, NSVT, and out-of-hospital V-fib arrest.  He had cardiac arrest in bed overnight.  His wife started CPR.  EMS arrived and took over.  He had 17 minutes before ROSC.  He received defibrillation x 3.  His cardiac troponins were greater than 24,000.  He was admitted on 12/30/2023 and discharged on 01/02/2024.  His echocardiogram showed an LVEF of 40-45% on 12/30/2023.  He underwent LHC on 12/31/2023.  He was noted to have completed anterior infarct and there was felt to be no benefit from LAD revascularization.  Collaterals were present.  He was placed on Plavix.  He presents to the clinic today for follow-up evaluation and states he feels well today.  He has not had any episodes of chest discomfort.  We reviewed his hospital admission.  He expresses understanding.  His wife reports that he left AGAINST MEDICAL ADVICE.  He has been compliant with his medications.  He has been fairly  sedentary at home since leaving the hospital.  We reviewed his reduced pumping function and need for medications.  I reviewed need for/recommendation for LifeVest.  They expressed understanding.  His wife brings in [facility] paperwork.  I will fill this out.  We will plan follow-up in 2 to 3 months.  I will plan to repeat fasting lipids and LFTs in about 6 to 8 weeks.  I will have the LifeVest representative reach out to them for fitting and device recommendations.  Today he denies chest pain, shortness of breath, lower extremity edema, fatigue, palpitations, melena, hematuria, hemoptysis, diaphoresis, weakness, presyncope, syncope, orthopnea, and PND.   Home Medications    Prior to Admission medications   Medication Sig Start Date End Date Taking? Authorizing Provider  amiodarone (PACERONE) 200 MG tablet Take 2 tablets (400 mg) by mouth twice daily for 3 days then 1 tablet (200 mg) twice daily thereafter 01/02/24   Bensimhon, Toribio SAUNDERS, MD  aspirin EC 81 MG tablet Take 1 tablet (81 mg total) by mouth daily. Swallow whole. 01/02/24   Bensimhon, Toribio SAUNDERS, MD  clopidogrel (PLAVIX) 75 MG tablet Take 1 tablet (75 mg total) by mouth daily. 01/02/24 01/01/25  Bensimhon, Toribio SAUNDERS, MD  empagliflozin (JARDIANCE) 10 MG TABS tablet Take 1 tablet (10 mg total) by mouth daily. 01/02/24   Bensimhon, Toribio SAUNDERS, MD  nitroGLYCERIN (NITROSTAT) 0.4 MG SL tablet Place 1 tablet (0.4 mg total) under the tongue every 5 (five) minutes as needed for chest pain. 01/02/24  01/01/25  Bensimhon, Toribio SAUNDERS, MD  oxyCODONE-acetaminophen (PERCOCET) 10-325 MG tablet Take 1 tablet by mouth every 4 (four) hours. 11/30/16   [provider]  rosuvastatin (CRESTOR) 40 MG tablet Take 1 tablet (40 mg total) by mouth daily. 01/02/24 01/01/25  Bensimhon, Toribio SAUNDERS, MD  sacubitril-valsartan (ENTRESTO) 49-51 MG Take 1 tablet by mouth 2 (two) times daily. 01/02/24   Bensimhon, Toribio SAUNDERS, MD  spironolactone (ALDACTONE) 25 MG tablet Take 1 tablet  (25 mg total) by mouth daily. 01/02/24 01/01/25  Bensimhon, Toribio SAUNDERS, MD    Family History    History reviewed. No pertinent family history. has no family status information on file.   Social History    Social History   Socioeconomic History   Marital status: Married    Spouse name: Not on file   Number of children: Not on file   Years of education: Not on file   Highest education level: Not on file  Occupational History   Not on file  Tobacco Use   Smoking status: Every Day    Types: Cigarettes   Smokeless tobacco: Never  Substance and Sexual Activity   Alcohol use: Not on file   Drug use: Not on file   Sexual activity: Not on file  Other Topics Concern   Not on file  Social History Narrative   Not on file   Social Drivers of Health   Financial Resource Strain: Not on file  Food Insecurity: Patient Unable To Answer (12/31/2023)   Hunger Vital Sign    Worried About Running Out of Food in the Last Year: Patient unable to answer    Ran Out of Food in the Last Year: Patient unable to answer  Transportation Needs: Patient Unable To Answer (12/31/2023)   PRAPARE - Transportation    Lack of Transportation (Medical): Patient unable to answer    Lack of Transportation (Non-Medical): Patient unable to answer  Physical Activity: Not on file  Stress: Not on file  Social Connections: Not on file  Intimate Partner Violence: Patient Unable To Answer (12/31/2023)   Humiliation, Afraid, Rape, and Kick questionnaire    Fear of Current or Ex-Partner: Patient unable to answer    Emotionally Abused: Patient unable to answer    Physically Abused: Patient unable to answer    Sexually Abused: Patient unable to answer     Review of Systems    General:  No chills, fever, night sweats or weight changes.  Cardiovascular:  No chest pain, dyspnea on exertion, edema, orthopnea, palpitations, paroxysmal nocturnal dyspnea. Dermatological: No rash, lesions/masses Respiratory: No cough,  dyspnea Urologic: No hematuria, dysuria Abdominal:   No nausea, vomiting, diarrhea, bright red blood per rectum, melena, or hematemesis Neurologic:  No visual changes, wkns, changes in mental status. All other systems reviewed and are otherwise negative except as noted above.  Physical Exam    VS:  BP 108/70   Pulse 74   Ht 5' 9 (1.753 m)   Wt 197 lb (89.4 kg)   SpO2 96%   BMI 29.09 kg/m  , BMI Body mass index is 29.09 kg/m. GEN: Well nourished, well developed, in no acute distress. HEENT: normal. Neck: Supple, no JVD, carotid bruits, or masses. Cardiac: RRR, no murmurs, rubs, or gallops. No clubbing, cyanosis, edema.  Radials/DP/PT 2+ and equal bilaterally.  Respiratory:  Respirations regular and unlabored, clear to auscultation bilaterally. GI: Soft, nontender, nondistended, BS + x 4. MS: no deformity or atrophy. Skin: warm and dry, no rash. Neuro:  Strength and sensation are intact. Psych: Normal affect.  Accessory Clinical Findings    Recent Labs: 12/30/2023: B Natriuretic Peptide 368.9; TSH 0.447 01/01/2024: ALT 64; Magnesium 2.1 01/02/2024: BUN 11; Creatinine, Ser 1.22; Hemoglobin 12.9; Platelets 183; Potassium 3.6; Sodium 139   Recent Lipid Panel    Component Value Date/Time   CHOL 173 12/30/2023 0044   TRIG 228 (H) 12/31/2023 0415   HDL 28 (L) 12/30/2023 0044   CHOLHDL 6.2 12/30/2023 0044   VLDL 34 12/30/2023 0044   LDLCALC 111 (H) 12/30/2023 0044         ECG personally reviewed by me today-none today.    Echocardiogram 12/30/2023  IMPRESSIONS     1. Left ventricular ejection fraction, by estimation, is 40 to 45%. The  left ventricle has mildly decreased function. The left ventricle  demonstrates regional wall motion abnormalities (see scoring  diagram/findings for description). Left ventricular  diastolic parameters are consistent with Grade III diastolic dysfunction  (restrictive).   2. Right ventricular systolic function is mildly reduced. The  right  ventricular size is normal.   3. A small pericardial effusion is present. The pericardial effusion is  anterior to the right ventricle. There is no evidence of cardiac  tamponade.   4. The mitral valve is normal in structure. No evidence of mitral valve  regurgitation. No evidence of mitral stenosis.   5. The aortic valve is tricuspid. Aortic valve regurgitation is trivial.  Aortic valve sclerosis is present, with no evidence of aortic valve  stenosis.   6. The IVC diameter is normal, but unable to estimate RAP while the  patient is vented.   Conclusion(s)/Recommendation(s): New mild LV systolic dysfunction with WMA  regionality in the anterior wall and apex. Mildly reduced RV systolic  function. No significant valvular disease seen.   FINDINGS   Left Ventricle: Left ventricular ejection fraction, by estimation, is 40  to 45%. The left ventricle has mildly decreased function. The left  ventricle demonstrates regional wall motion abnormalities. The left  ventricular internal cavity size was normal  in size. There is borderline left ventricular hypertrophy. Left  ventricular diastolic parameters are consistent with Grade III diastolic  dysfunction (restrictive).     LV Wall Scoring:  The mid anteroseptal segment, mid inferoseptal segment, and mid anterior  segment are akinetic. The basal anteroseptal segment is hypokinetic.   Right Ventricle: The right ventricular size is normal. No increase in  right ventricular wall thickness. Right ventricular systolic function is  mildly reduced.   Left Atrium: Left atrial size was normal in size.   Right Atrium: Right atrial size was normal in size.   Pericardium: A small pericardial effusion is present. The pericardial  effusion is anterior to the right ventricle. There is no evidence of  cardiac tamponade.   Mitral Valve: The mitral valve is normal in structure. Mild mitral annular  calcification. No evidence of mitral valve  regurgitation. No evidence of  mitral valve stenosis.   Tricuspid Valve: The tricuspid valve is normal in structure. Tricuspid  valve regurgitation is not demonstrated. No evidence of tricuspid  stenosis.   Aortic Valve: The aortic valve is tricuspid. Aortic valve regurgitation is  trivial. Aortic valve sclerosis is present, with no evidence of aortic  valve stenosis.   Pulmonic Valve: The pulmonic valve was normal in structure. Pulmonic valve  regurgitation is not visualized. No evidence of pulmonic stenosis.   Aorta: The aortic root and ascending aorta are structurally normal, with  no evidence of  dilitation.   Venous: The IVC diameter is normal, but unable to estimate RAP while the  patient is vented.   IAS/Shunts: No atrial level shunt detected by color flow Doppler.    LHC 12/31/2023    Prox RCA lesion is 30% stenosed.   Mid RCA lesion is 20% stenosed.   1st Mrg lesion is 60% stenosed.   Prox LAD to Mid LAD lesion is 40% stenosed.   Mid LAD lesion is 100% stenosed.   1.  Significant one-vessel coronary artery disease with occluded mid to distal LAD with collaterals from the septal and diagonal branches.  Moderate stenosis in OM1 and no other obstructive disease. 2.  Left ventricular angiography was not performed.  EF was 40 to 45% by echo. 3.  Mildly elevated left ventricular end-diastolic pressure at 18 mmHg.   Recommendations: The patient completed an anterior infarct and there is likely no benefit from LAD revascularization at this point. Recommend aggressive medical therapy. I added clopidogrel to be used for 1 year.  Diagnostic Dominance: Right  Intervention    Assessment & Plan   1.  Coronary artery disease-no chest pain today.  Denies exertional chest discomfort.  Underwent LHC on 12/31/2023.  He had OoH V-fib arrest and cardiac troponins elevated to greater than 24,000.  He suffered LAD infarct.  However, at time of catheterization anterior infarct was  completed.  There was felt to be no benefit from attempting revascularization. Continue aspirin, Plavix Heart healthy low-sodium diet  Ischemic cardiomyopathy, acute HFmrEF-euvolemic today.  Slowly increasing physical activity.  Tolerating medications well. Continue Jardiance, spironolactone, Entresto Continue LifeVest Will plan to repeat echocardiogram once GDMT has been optimized x 1 month but not before 2/26  Hyperlipidemia-LDL 111 on 12/30/23. High-fiber diet Continue to slowly increase physical activity Continue aspirin, clopidogrel Continue crestor 40 mg daily Repeat fasting lipids and LFTs in 6-8 weeks  V-fib arrest, NSVT-denies episodes of accelerated or irregular heartbeat.  Denies shortness of breath, dizziness, presyncope and lightheadedness. Continue amiodarone Avoid triggers caffeine, chocolate, EtOH, dehydration excetra.  Disposition: Follow-up with Dr. Darron or me in 2-3 months.   Work note provided to patient's wife.  Josefa HERO. Luka Stohr NP-C     01/10/2024, 3:50 PM [facility] [address] [place], [place] [address] Office [phone]    Notice: This dictation was prepared with Dragon dictation along with smaller phrase technology. Any transcriptional errors that result from this process are unintentional and may not be corrected upon review.   I spent 14 minutes examining this patient, reviewing medications, and using patient centered shared decision making involving their cardiac care.   I spent  20 minutes reviewing past medical history,  medications, and prior cardiac tests.

## 2024-01-10 ENCOUNTER — Encounter: Payer: Self-pay | Admitting: General Practice

## 2024-01-10 ENCOUNTER — Ambulatory Visit: Attending: General Practice | Admitting: General Practice

## 2024-01-10 VITALS — BP 108/70 | HR 74 | Ht 69.0 in | Wt 197.0 lb

## 2024-01-10 DIAGNOSIS — E782 Mixed hyperlipidemia: Secondary | ICD-10-CM | POA: Diagnosis not present

## 2024-01-10 DIAGNOSIS — I255 Ischemic cardiomyopathy: Secondary | ICD-10-CM

## 2024-01-10 DIAGNOSIS — I4729 Other ventricular tachycardia: Secondary | ICD-10-CM

## 2024-01-10 DIAGNOSIS — I2510 Atherosclerotic heart disease of native coronary artery without angina pectoris: Secondary | ICD-10-CM | POA: Diagnosis not present

## 2024-01-10 NOTE — Patient Instructions (Signed)
 Medication Instructions:  Your physician recommends that you continue on your current medications as directed. Please refer to the Current Medication list given to you today.   *If you need a refill on your cardiac medications before your next appointment, please call your pharmacy*  Lab Work: LFT and LIPIDs in 6-8 weeks  If you have labs (blood work) drawn today and your tests are completely normal, you will receive your results only by: MyChart Message (if you have MyChart) OR A paper copy in the mail If you have any lab test that is abnormal or we need to change your treatment, we will call you to review the results.  Testing/Procedures: None  Follow-Up: At [facility], you and your health needs are our priority.  As part of our continuing mission to provide you with exceptional heart care, our providers are all part of one team.  This team includes your primary Cardiologist (physician) and Advanced Practice Providers or APPs (Physician Assistants and Nurse Practitioners) who all work together to provide you with the care you need, when you need it.  Your next appointment:   2.-3 month(s)  Provider:   Josefa Beauvais, NP

## 2024-01-13 ENCOUNTER — Telehealth: Payer: Self-pay | Admitting: General Practice

## 2024-01-13 DIAGNOSIS — Z0279 Encounter for issue of other medical certificate: Secondary | ICD-10-CM

## 2024-01-13 NOTE — Telephone Encounter (Signed)
 Received an FMLA form from [employer] for his spouse, Asier Desroches.  Patient paid the $29 form fee and signed the Release of Information. Form in J. Cleaver's box.

## 2024-01-13 NOTE — Telephone Encounter (Signed)
 Completed FMLA form faxed to [employer] for spouse, Dory Verdun.  Form has been scanned into chart. Billing and patient notified.

## 2024-01-24 ENCOUNTER — Other Ambulatory Visit (HOSPITAL_COMMUNITY): Payer: Self-pay | Admitting: Internal Medicine

## 2024-01-25 ENCOUNTER — Other Ambulatory Visit (HOSPITAL_COMMUNITY): Payer: Self-pay | Admitting: Internal Medicine

## 2024-01-25 NOTE — Progress Notes (Unsigned)
 Cardiology Office Note:   Date:  01/29/2024  ID:  James Castro, DOB [birth year], MRN [id number] PCP:  James Mutton, MD  [facility] Providers Cardiologist:  Wendel Haws, MD Referring MD: James Mutton, MD  Chief Complaint/Reason for Referral: Hospital follow-up cardiac arrest ASSESSMENT:    1. Cardiac arrest (HCC)   2. Coronary artery disease involving native coronary artery of native heart without angina pectoris   3. Ischemic cardiomyopathy   4. Chronic combined systolic and diastolic congestive heart failure (HCC)   5. Hyperlipidemia LDL goal <55   6. BMI 29.0-29.9,adult   7. Prolonged Q-T interval on ECG     PLAN:   In order of problems listed above: Cardiac arrest: Decrease amiodarone  to 200 mg daily.  At next visit we will consider stopping amiodarone  at next visit and starting Toprol.  Refer to cardiac rehabilitation Coronary artery disease: CTO of mid LAD.  Continue DAPT with Plavix  until October 2026 then stop Plavix .  Continue rosuvastatin  40 mg.   Ischemic cardiomyopathy: Continue Entresto  49 x 51 mg twice daily, spironolactone  25 mg, Jardiance  10 mg.  Looks euvolemic on exam.  Check echocardiogram in 5-6 months. Hyperlipidemia: Continue rosuvastatin  40 mg.  Check lipid panel and LFTs today Elevated BMI: Diet and exercise modification Prolonged QT: Check EKG today demonstrates that QTc is much improved.  QTc now 453. Tobacco abuse:  Stressed need for abstinence.  No claudication.    Cardiac Rehabilitation Eligibility Assessment  The patient is ready to start cardiac rehabilitation from a cardiac standpoint.           Dispo:  Return in about 6 months (around 07/28/2024).       I spent 35 minutes reviewing all clinical data during and prior to this visit including all relevant imaging studies, laboratories, clinical information from other health systems and prior notes from both Cardiology and other specialties, interviewing the patient, conducting a complete  physical examination, and coordinating care in order to formulate a comprehensive and personalized evaluation and treatment plan.   History of Present Illness:    FOCUSED PROBLEM LIST:   VF arrest October 2025 Mid LAD CTO coronary angiography CAD Mid LAD CTO coronary angiography October 2025 Ischemic cardiomyopathy G3 DD, no significant valvular issues, EF 40 to 45% TTE October 2025 Hyperlipidemia LP(a) less than 8.4 BMI 01 February 2024:  Patient consents to use of AI scribe. The patient is a 58 year old male with above listed medical problems here for cardiology follow-up.  The patient suffered a ventricular for the tori arrest in October.  Emergency coronary angiography demonstrated CTO of the mid LAD that was treated medically.  He was ultimately extubated.  An echocardiogram demonstrated ejection fraction of roughly 40 to 45%.  His course was marked by NSVT treated with amiodarone .  He feels back to his normal self with no chest pain, leg swelling, or breathing difficulties. He compares his current state to how he felt in July, stating that he feels just as good today as he did then. He has had low blood pressures at times.  He attributes this to his amiodarone .  He otherwise feels well and has had no other issues with his medications.  He is taking his medications without missing doses.  He denies any chest pain, presyncope, syncope, palpitations, paroxysmal atrial dyspnea, orthopnea.  He has not yet started cardiac rehabilitation.  He smokes about half a pack of cigarettes a day, although he notes that often the cigarettes just 'hang in his hand  and burn.'       Current Medications: Current Meds  Medication Sig   ASPIRIN  LOW DOSE 81 MG tablet TAKE 1 TABLET (81 MG TOTAL) BY MOUTH DAILY. SWALLOW WHOLE.   clopidogrel  (PLAVIX ) 75 MG tablet Take 1 tablet (75 mg total) by mouth daily.   empagliflozin  (JARDIANCE ) 10 MG TABS tablet Take 1 tablet (10 mg total) by mouth daily.    nitroGLYCERIN  (NITROSTAT ) 0.4 MG SL tablet Place 1 tablet (0.4 mg total) under the tongue every 5 (five) minutes as needed for chest pain.   Omega-3 1000 MG CAPS Take 1 capsule by mouth daily.   oxyCODONE -acetaminophen  (PERCOCET) 10-325 MG tablet Take 1 tablet by mouth every 4 (four) hours.   rosuvastatin  (CRESTOR ) 40 MG tablet TAKE 1 TABLET BY MOUTH EVERY DAY   sacubitril -valsartan  (ENTRESTO ) 49-51 MG Take 1 tablet by mouth 2 (two) times daily.   spironolactone  (ALDACTONE ) 25 MG tablet TAKE 1 TABLET (25 MG TOTAL) BY MOUTH DAILY.     Review of Systems:   Please see the history of present illness.    All other systems reviewed and are negative.     EKGs/Labs/Other Test Reviewed:   EKG: Normal sinus rhythm prolonged QT  EKG Interpretation Date/Time:  Wednesday January 29 2024 08:40:08 EST Ventricular Rate:  57 PR Interval:  186 QRS Duration:  106 QT Interval:  466 QTC Calculation: 453 R Axis:   48  Text Interpretation: Sinus bradycardia Cannot rule out Inferior infarct , age undetermined Cannot rule out Anterior infarct , age undetermined ST & T wave abnormality, consider lateral ischemia When compared with ECG of 30-Dec-2023 05:23, QRS duration has increased Minimal criteria for Inferior infarct are now Present T wave inversion now evident in Lateral leads QT has shortened Confirmed by Wendel Haws ([id number]) on 01/29/2024 8:44:12 AM        CARDIAC STUDIES: Refer to CV Procedures and Imaging Tabs   Risk Assessment/Calculations:          Physical Exam:   VS:  BP 126/70 (BP Location: Right Arm, Patient Position: Sitting, Cuff Size: Normal)   Pulse (!) 57   Ht 5' 10 (1.778 m)   Wt 195 lb 9.6 oz (88.7 kg)   SpO2 97%   BMI 28.07 kg/m        Wt Readings from Last 3 Encounters:  01/29/24 195 lb 9.6 oz (88.7 kg)  01/10/24 197 lb (89.4 kg)  01/02/24 200 lb 3.2 oz (90.8 kg)      GENERAL:  No apparent distress, AOx3 HEENT:  No carotid bruits, +2 carotid impulses, no scleral  icterus CAR: RRR no murmurs, gallops, rubs, or thrills RES:  Clear to auscultation bilaterally ABD:  Soft, nontender, nondistended, positive bowel sounds x 4 VASC:  +2 radial pulses, +2 carotid pulses NEURO:  CN 2-12 grossly intact; motor and sensory grossly intact PSYCH:  No active depression or anxiety EXT:  No edema, ecchymosis, or cyanosis  Signed, Saga Balthazar K Asako Saliba, MD  01/29/2024 9:00 AM    [facility] [address] [place], [place], [place]  [address] Phone: [phone]; Fax: [phone]   Note:  This document was prepared using Dragon voice recognition software and may include unintentional dictation errors.

## 2024-01-29 ENCOUNTER — Telehealth: Payer: Self-pay | Admitting: Internal Medicine

## 2024-01-29 ENCOUNTER — Other Ambulatory Visit (HOSPITAL_COMMUNITY): Payer: Self-pay | Admitting: Internal Medicine

## 2024-01-29 ENCOUNTER — Encounter: Payer: Self-pay | Admitting: Internal Medicine

## 2024-01-29 ENCOUNTER — Ambulatory Visit: Attending: Internal Medicine | Admitting: Internal Medicine

## 2024-01-29 VITALS — BP 126/70 | HR 57 | Ht 70.0 in | Wt 195.6 lb

## 2024-01-29 DIAGNOSIS — Z6829 Body mass index (BMI) 29.0-29.9, adult: Secondary | ICD-10-CM

## 2024-01-29 DIAGNOSIS — I5042 Chronic combined systolic (congestive) and diastolic (congestive) heart failure: Secondary | ICD-10-CM | POA: Diagnosis not present

## 2024-01-29 DIAGNOSIS — I2510 Atherosclerotic heart disease of native coronary artery without angina pectoris: Secondary | ICD-10-CM

## 2024-01-29 DIAGNOSIS — I469 Cardiac arrest, cause unspecified: Secondary | ICD-10-CM

## 2024-01-29 DIAGNOSIS — R9431 Abnormal electrocardiogram [ECG] [EKG]: Secondary | ICD-10-CM

## 2024-01-29 DIAGNOSIS — E785 Hyperlipidemia, unspecified: Secondary | ICD-10-CM

## 2024-01-29 DIAGNOSIS — I255 Ischemic cardiomyopathy: Secondary | ICD-10-CM | POA: Diagnosis not present

## 2024-01-29 MED ORDER — EMPAGLIFLOZIN 10 MG PO TABS: 10.0000 mg | ORAL_TABLET | Freq: Every day | ORAL | 3 refills | Status: AC

## 2024-01-29 MED ORDER — SACUBITRIL-VALSARTAN 49-51 MG PO TABS: 1.0000 | ORAL_TABLET | Freq: Two times a day (BID) | ORAL | 3 refills | Status: AC

## 2024-01-29 MED ORDER — CLOPIDOGREL BISULFATE 75 MG PO TABS: 75.0000 mg | ORAL_TABLET | Freq: Every day | ORAL | 3 refills | Status: AC

## 2024-01-29 MED ORDER — AMIODARONE HCL 200 MG PO TABS: 200.0000 mg | ORAL_TABLET | Freq: Every day | ORAL | 3 refills | Status: AC

## 2024-01-29 NOTE — Addendum Note (Signed)
 Addended by: GORDON RONAL SQUIBB on: 01/29/2024 09:03 AM   Modules accepted: Orders

## 2024-01-29 NOTE — Telephone Encounter (Signed)
 Refills has been sent to the pharmacy.

## 2024-01-29 NOTE — Telephone Encounter (Signed)
*  STAT* If patient is at the pharmacy, call can be transferred to refill team.   1. Which medications need to be refilled? (please list name of each medication and dose if known) clopidogrel  (PLAVIX ) 75 MG tablet  empagliflozin  (JARDIANCE ) 10 MG TABS tablet  sacubitril -valsartan  (ENTRESTO ) 49-51 MG      4. Which pharmacy/location (including street and city if local pharmacy) is medication to be sent to? [id number] - [place], [place] - [address]     5. Do they need a 30 day or 90 day supply? 90  States pt is not CHF clinic pt, only saw Dr. Cherrie in the hospital

## 2024-01-29 NOTE — Patient Instructions (Signed)
 Medication Instructions:  CHANGE Amiodarone  to 200 mg once daily  *If you need a refill on your cardiac medications before your next appointment, please call your pharmacy*  Lab Work: To be completed today: lipid panel and LFT  If you have labs (blood work) drawn today and your tests are completely normal, you will receive your results only by: MyChart Message (if you have MyChart) OR A paper copy in the mail If you have any lab test that is abnormal or we need to change your treatment, we will call you to review the results.  Testing/Procedures: Your physician has requested that you have an echocardiogram. Echocardiography is a painless test that uses sound waves to create images of your heart. It provides your doctor with information about the size and shape of your heart and how well your heart's chambers and valves are working. This procedure takes approximately one hour. There are no restrictions for this procedure. Please do NOT wear cologne, perfume, aftershave, or lotions (deodorant is allowed). Please arrive 15 minutes prior to your appointment time.  Please note: We ask at that you not bring children with you during ultrasound (echo/ vascular) testing. Due to room size and safety concerns, children are not allowed in the ultrasound rooms during exams. Our front office staff cannot provide observation of children in our lobby area while testing is being conducted. An adult accompanying a patient to their appointment will only be allowed in the ultrasound room at the discretion of the ultrasound technician under special circumstances. We apologize for any inconvenience.   Follow-Up: At [facility], you and your health needs are our priority.  As part of our continuing mission to provide you with exceptional heart care, our providers are all part of one team.  This team includes your primary Cardiologist (physician) and Advanced Practice Providers or APPs (Physician Assistants  and Nurse Practitioners) who all work together to provide you with the care you need, when you need it.  Your next appointment:   6 month(s)  Provider:   Lurena Red, MD    We recommend signing up for the patient portal called MyChart.  Sign up information is provided on this After Visit Summary.  MyChart is used to connect with patients for Virtual Visits (Telemedicine).  Patients are able to view lab/test results, encounter notes, upcoming appointments, etc.  Non-urgent messages can be sent to your provider as well.   To learn more about what you can do with MyChart, go to [email or web address].   Other Instructions You have been referred to cardiac rehab. Their office will be in contact with you to set up this referral.

## 2024-01-30 LAB — HEPATIC FUNCTION PANEL
ALT: 18 IU/L (ref 0–44)
AST: 16 IU/L (ref 0–40)
Albumin: 4.4 g/dL (ref 3.8–4.9)
Alkaline Phosphatase: 125 IU/L — ABNORMAL HIGH (ref 47–123)
Bilirubin Total: 0.2 mg/dL (ref 0.0–1.2)
Bilirubin, Direct: 0.1 mg/dL (ref 0.00–0.40)
Total Protein: 7 g/dL (ref 6.0–8.5)

## 2024-01-30 LAB — LIPID PANEL
Chol/HDL Ratio: 2.7 ratio (ref 0.0–5.0)
Cholesterol, Total: 94 mg/dL — ABNORMAL LOW (ref 100–199)
HDL: 35 mg/dL — ABNORMAL LOW (ref >39)
LDL Chol Calc (NIH): 38 mg/dL (ref 0–99)
Triglycerides: 117 mg/dL (ref 0–149)
VLDL Cholesterol Cal: 21 mg/dL (ref 5–40)

## 2024-01-31 ENCOUNTER — Ambulatory Visit: Payer: Self-pay | Admitting: Internal Medicine

## 2024-02-03 ENCOUNTER — Telehealth (HOSPITAL_COMMUNITY): Payer: Self-pay

## 2024-02-03 NOTE — Telephone Encounter (Signed)
 Called patient to schedule cardiac rehab, patient states he could do 'maximum 1 day per month' and would only be interested if he could come in to be trained how to do exercises and then quit. Explained program to patient and that he would need to attend at least 2 days per week for a minimum of 4 weeks. Patient is no longer interested in the program.  Closing referral.

## 2024-02-05 NOTE — Telephone Encounter (Signed)
 Letter of results sent to pt

## 2024-07-28 ENCOUNTER — Ambulatory Visit (HOSPITAL_COMMUNITY)
